# Patient Record
Sex: Female | Born: 1950 | Race: White | Hispanic: No | Marital: Married | Smoking: Former smoker
Health system: Southern US, Community
[De-identification: ages and names within clinical notes are randomized; demographics above are authoritative.]

## PROBLEM LIST (undated history)

## (undated) DIAGNOSIS — D860 Sarcoidosis of lung: Secondary | ICD-10-CM

## (undated) DIAGNOSIS — E669 Obesity, unspecified: Secondary | ICD-10-CM

## (undated) DIAGNOSIS — E039 Hypothyroidism, unspecified: Secondary | ICD-10-CM

## (undated) DIAGNOSIS — H3530 Unspecified macular degeneration: Secondary | ICD-10-CM

## (undated) DIAGNOSIS — E119 Type 2 diabetes mellitus without complications: Secondary | ICD-10-CM

## (undated) DIAGNOSIS — H8110 Benign paroxysmal vertigo, unspecified ear: Secondary | ICD-10-CM

## (undated) DIAGNOSIS — Z4542 Encounter for adjustment and management of neuropacemaker (brain) (peripheral nerve) (spinal cord): Secondary | ICD-10-CM

## (undated) DIAGNOSIS — J40 Bronchitis, not specified as acute or chronic: Secondary | ICD-10-CM

## (undated) DIAGNOSIS — S46819A Strain of other muscles, fascia and tendons at shoulder and upper arm level, unspecified arm, initial encounter: Secondary | ICD-10-CM

## (undated) DIAGNOSIS — M7580 Other shoulder lesions, unspecified shoulder: Secondary | ICD-10-CM

## (undated) DIAGNOSIS — K219 Gastro-esophageal reflux disease without esophagitis: Secondary | ICD-10-CM

## (undated) DIAGNOSIS — M7520 Bicipital tendinitis, unspecified shoulder: Secondary | ICD-10-CM

## (undated) DIAGNOSIS — N189 Chronic kidney disease, unspecified: Secondary | ICD-10-CM

## (undated) DIAGNOSIS — N184 Chronic kidney disease, stage 4 (severe): Secondary | ICD-10-CM

## (undated) DIAGNOSIS — D869 Sarcoidosis, unspecified: Secondary | ICD-10-CM

## (undated) DIAGNOSIS — M4800 Spinal stenosis, site unspecified: Secondary | ICD-10-CM

## (undated) DIAGNOSIS — N179 Acute kidney failure, unspecified: Secondary | ICD-10-CM

## (undated) DIAGNOSIS — H409 Unspecified glaucoma: Secondary | ICD-10-CM

## (undated) DIAGNOSIS — N10 Acute tubulo-interstitial nephritis: Secondary | ICD-10-CM

## (undated) DIAGNOSIS — G4733 Obstructive sleep apnea (adult) (pediatric): Secondary | ICD-10-CM

## (undated) DIAGNOSIS — R0600 Dyspnea, unspecified: Secondary | ICD-10-CM

## (undated) DIAGNOSIS — D649 Anemia, unspecified: Secondary | ICD-10-CM

## (undated) DIAGNOSIS — Z8669 Personal history of other diseases of the nervous system and sense organs: Secondary | ICD-10-CM

## (undated) DIAGNOSIS — I10 Essential (primary) hypertension: Secondary | ICD-10-CM

## (undated) HISTORY — PX: CARPAL TUNNEL RELEASE: SHX101

## (undated) HISTORY — PX: COLONOSCOPY: SHX174

## (undated) HISTORY — PX: NEUROMA SURGERY: SHX722

## (undated) HISTORY — PX: SHOULDER SURGERY: SHX246

## (undated) HISTORY — PX: GLAUCOMA VALVE INSERTION: SHX5297

## (undated) HISTORY — PX: EYE SURGERY: SHX253

## (undated) HISTORY — PX: APPENDECTOMY: SHX54

## (undated) HISTORY — PX: CHOLECYSTECTOMY: SHX55

## (undated) HISTORY — PX: SPINAL CORD STIMULATOR INSERTION: SHX5378

---

## 2019-02-20 DIAGNOSIS — D860 Sarcoidosis of lung: Secondary | ICD-10-CM | POA: Diagnosis present

## 2019-02-20 DIAGNOSIS — I10 Essential (primary) hypertension: Secondary | ICD-10-CM | POA: Diagnosis present

## 2019-02-20 DIAGNOSIS — E039 Hypothyroidism, unspecified: Secondary | ICD-10-CM | POA: Diagnosis present

## 2019-02-20 DIAGNOSIS — E119 Type 2 diabetes mellitus without complications: Secondary | ICD-10-CM

## 2020-08-23 ENCOUNTER — Encounter: Payer: Self-pay | Admitting: *Deleted

## 2020-08-24 ENCOUNTER — Ambulatory Visit: Payer: Medicare HMO | Admitting: Anesthesiology

## 2020-08-24 ENCOUNTER — Ambulatory Visit
Admission: RE | Admit: 2020-08-24 | Discharge: 2020-08-24 | Disposition: A | Discharge status: Home / Self Care | Payer: Medicare HMO | Attending: Gastroenterology | Admitting: Gastroenterology

## 2020-08-24 ENCOUNTER — Encounter: Payer: Self-pay | Admitting: Anesthesiology

## 2020-08-24 ENCOUNTER — Other Ambulatory Visit: Payer: Self-pay

## 2020-08-24 ENCOUNTER — Encounter
Admission: RE | Disposition: A | Discharge status: Home / Self Care | Payer: Self-pay | Source: Home / Self Care | Attending: Gastroenterology

## 2020-08-24 DIAGNOSIS — Z86010 Personal history of colonic polyps: Secondary | ICD-10-CM | POA: Insufficient documentation

## 2020-08-24 DIAGNOSIS — K6289 Other specified diseases of anus and rectum: Secondary | ICD-10-CM | POA: Diagnosis not present

## 2020-08-24 DIAGNOSIS — Z6838 Body mass index (BMI) 38.0-38.9, adult: Secondary | ICD-10-CM | POA: Insufficient documentation

## 2020-08-24 DIAGNOSIS — Z79899 Other long term (current) drug therapy: Secondary | ICD-10-CM | POA: Insufficient documentation

## 2020-08-24 DIAGNOSIS — K5730 Diverticulosis of large intestine without perforation or abscess without bleeding: Secondary | ICD-10-CM | POA: Diagnosis not present

## 2020-08-24 DIAGNOSIS — I10 Essential (primary) hypertension: Secondary | ICD-10-CM | POA: Insufficient documentation

## 2020-08-24 DIAGNOSIS — E119 Type 2 diabetes mellitus without complications: Secondary | ICD-10-CM | POA: Diagnosis not present

## 2020-08-24 DIAGNOSIS — Z87891 Personal history of nicotine dependence: Secondary | ICD-10-CM | POA: Insufficient documentation

## 2020-08-24 DIAGNOSIS — Z7984 Long term (current) use of oral hypoglycemic drugs: Secondary | ICD-10-CM | POA: Diagnosis not present

## 2020-08-24 DIAGNOSIS — Z880 Allergy status to penicillin: Secondary | ICD-10-CM | POA: Insufficient documentation

## 2020-08-24 DIAGNOSIS — Z7982 Long term (current) use of aspirin: Secondary | ICD-10-CM | POA: Diagnosis not present

## 2020-08-24 DIAGNOSIS — Z1211 Encounter for screening for malignant neoplasm of colon: Secondary | ICD-10-CM | POA: Diagnosis not present

## 2020-08-24 DIAGNOSIS — E669 Obesity, unspecified: Secondary | ICD-10-CM | POA: Insufficient documentation

## 2020-08-24 DIAGNOSIS — D1779 Benign lipomatous neoplasm of other sites: Secondary | ICD-10-CM | POA: Insufficient documentation

## 2020-08-24 HISTORY — DX: Type 2 diabetes mellitus without complications: E11.9

## 2020-08-24 HISTORY — DX: Sarcoidosis, unspecified: D86.9

## 2020-08-24 HISTORY — DX: Spinal stenosis, site unspecified: M48.00

## 2020-08-24 HISTORY — DX: Essential (primary) hypertension: I10

## 2020-08-24 HISTORY — PX: COLONOSCOPY WITH PROPOFOL: SHX5780

## 2020-08-24 HISTORY — DX: Gastro-esophageal reflux disease without esophagitis: K21.9

## 2020-08-24 HISTORY — DX: Unspecified macular degeneration: H35.30

## 2020-08-24 HISTORY — DX: Chronic kidney disease, unspecified: N18.9

## 2020-08-24 LAB — GLUCOSE, CAPILLARY: Glucose-Capillary: 142 mg/dL — ABNORMAL HIGH (ref 70–99)

## 2020-08-24 SURGERY — COLONOSCOPY WITH PROPOFOL
Anesthesia: General

## 2020-08-24 MED ORDER — SODIUM CHLORIDE 0.9 % IV SOLN: INTRAVENOUS | Status: DC

## 2020-08-24 MED ORDER — PROPOFOL 500 MG/50ML IV EMUL
INTRAVENOUS | Status: DC | PRN
  Administered 2020-08-24: 120 ug/kg/min via INTRAVENOUS

## 2020-08-24 MED ORDER — EPHEDRINE SULFATE 50 MG/ML IJ SOLN
INTRAMUSCULAR | Status: DC | PRN
  Administered 2020-08-24: 10 mg via INTRAVENOUS

## 2020-08-24 MED ORDER — PROPOFOL 500 MG/50ML IV EMUL
INTRAVENOUS | Status: AC
  Filled 2020-08-24: qty 50

## 2020-08-24 NOTE — Transfer of Care (Signed)
Immediate Anesthesia Transfer of Care Note  Patient: Lanea Vankirk  Procedure(s) Performed: COLONOSCOPY WITH PROPOFOL (N/A )  Patient Location: PACU  Anesthesia Type:General  Level of Consciousness: awake and sedated  Airway & Oxygen Therapy: Patient Spontanous Breathing and Patient connected to nasal cannula oxygen  Post-op Assessment: Report given to RN and Post -op Vital signs reviewed and stable  Post vital signs: Reviewed and stable  Last Vitals:  Vitals Value Taken Time  BP    Temp    Pulse    Resp    SpO2      Last Pain:  Vitals:   08/24/20 0846  TempSrc: Temporal  PainSc: 0-No pain         Complications: No complications documented.

## 2020-08-24 NOTE — Anesthesia Procedure Notes (Signed)
Performed by: Cook-Martin, Jettie Mannor °Pre-anesthesia Checklist: Patient identified, Emergency Drugs available, Suction available, Patient being monitored and Timeout performed °Patient Re-evaluated:Patient Re-evaluated prior to induction °Oxygen Delivery Method: Simple face mask °Preoxygenation: Pre-oxygenation with 100% oxygen °Induction Type: IV induction °Placement Confirmation: positive ETCO2 and CO2 detector ° ° ° ° ° ° °

## 2020-08-24 NOTE — H&P (Signed)
Outpatient short stay form Pre-procedure 08/24/2020 9:31 AM Rachael Miyamoto MD, MPH  Primary Physician: Dr. Edwina Barth  Reason for visit:  Surveillance colonoscopy  History of present illness:   70 y/o lady with obesity, hypertension, and DM II here for surveillance colonoscopy. Last colonoscopy was 4-5 years and was normal but had polyps previously. No family history of colon cancer. No blood thinners besides aspirin. History of appendectomy and cholecystectomy.    Current Facility-Administered Medications:  .  0.9 %  sodium chloride infusion, , Intravenous, Continuous, Tymika Grilli, Hilton Cork, MD, Last Rate: 20 mL/hr at 08/24/20 0931, Continued from Pre-op at 08/24/20 0931  Medications Prior to Admission  Medication Sig Dispense Refill Last Dose  . aspirin 81 MG chewable tablet Chew 81 mg by mouth daily.   Past Week at Unknown time  . atorvastatin (LIPITOR) 40 MG tablet Take 40 mg by mouth daily.   08/22/2020  . dorzolamide-timolol (COSOPT) 22.3-6.8 MG/ML ophthalmic solution 1 drop 2 (two) times daily.   08/22/2020  . enalapril (VASOTEC) 10 MG tablet Take 10 mg by mouth daily.   08/22/2020  . fluticasone (FLONASE) 50 MCG/ACT nasal spray Place into both nostrils daily.   08/22/2020  . Latanoprostene Bunod (VYZULTA) 0.024 % SOLN Apply to eye.   08/22/2020  . levothyroxine (SYNTHROID) 125 MCG tablet Take 125 mcg by mouth daily before breakfast.   08/22/2020  . metFORMIN (GLUMETZA) 1000 MG (MOD) 24 hr tablet Take 1,000 mg by mouth daily with breakfast.   08/22/2020  . montelukast (SINGULAIR) 10 MG tablet Take 10 mg by mouth at bedtime.   08/22/2020  . Netarsudil-Latanoprost 0.02-0.005 % SOLN Apply to eye.     Marland Kitchen omeprazole (PRILOSEC) 40 MG capsule Take 40 mg by mouth daily.   08/22/2020  . potassium chloride (KLOR-CON) 10 MEQ tablet Take 10 mEq by mouth daily.   08/22/2020     Allergies  Allergen Reactions  . Penicillins      Past Medical History:  Diagnosis Date  . CKD (chronic kidney disease)   .  Diabetes mellitus ([place])   . GERD (gastroesophageal reflux disease)   . HTN (hypertension)   . Macular degeneration   . Sarcoidosis   . Spinal stenosis     Review of systems:  Otherwise negative.    Physical Exam  Gen: Alert, oriented. Appears stated age.  HEENT: PERRLA. Lungs: No respiratory distress CV: RRR Abd: soft, benign, no masses Ext: No edema    Planned procedures: Proceed with colonoscopy. The patient understands the nature of the planned procedure, indications, risks, alternatives and potential complications including but not limited to bleeding, infection, perforation, damage to internal organs and possible oversedation/side effects from anesthesia. The patient agrees and gives consent to proceed.  Please refer to procedure notes for findings, recommendations and patient disposition/instructions.     Rachael Miyamoto MD, MPH Gastroenterology 08/24/2020  9:31 AM

## 2020-08-24 NOTE — Anesthesia Preprocedure Evaluation (Addendum)
Anesthesia Evaluation  Patient identified by MRN, date of birth, ID band Patient awake    Reviewed: Allergy & Precautions, NPO status , Patient's Chart, lab work & pertinent test results  Airway Mallampati: II  TM Distance: >3 FB     Dental   Pulmonary former smoker,    Pulmonary exam normal        Cardiovascular hypertension, Normal cardiovascular exam     Neuro/Psych negative neurological ROS  negative psych ROS   GI/Hepatic Neg liver ROS, GERD  ,  Endo/Other  diabetesHypothyroidism   Renal/GU Renal InsufficiencyRenal disease  negative genitourinary   Musculoskeletal negative musculoskeletal ROS (+)   Abdominal Normal abdominal exam  (+)   Peds negative pediatric ROS (+)  Hematology negative hematology ROS (+)   Anesthesia Other Findings Past Medical History: No date: CKD (chronic kidney disease) No date: Diabetes mellitus ([facility]) No date: GERD (gastroesophageal reflux disease) No date: HTN (hypertension) No date: Macular degeneration No date: Sarcoidosis No date: Spinal stenosis  Reproductive/Obstetrics                             Anesthesia Physical Anesthesia Plan  ASA: III  Anesthesia Plan: General   Post-op Pain Management:    Induction: Intravenous  PONV Risk Score and Plan: Propofol infusion and TIVA  Airway Management Planned: Nasal Cannula  Additional Equipment:   Intra-op Plan:   Post-operative Plan:   Informed Consent: I have reviewed the patients History and Physical, chart, labs and discussed the procedure including the risks, benefits and alternatives for the proposed anesthesia with the patient or authorized representative who has indicated his/her understanding and acceptance.     Dental advisory given  Plan Discussed with: Surgeon and CRNA  Anesthesia Plan Comments:         Anesthesia Quick Evaluation

## 2020-08-24 NOTE — Op Note (Signed)
[facility] Gastroenterology Patient Name: Rachael Barnes Procedure Date: 08/24/2020 9:22 AM MRN: [id number] Account #: [id number] Date of Birth: 16-Mar-1951 Admit Type: Outpatient Age: 70 Room: [facility] ENDO ROOM 1 Gender: Female Note Status: Finalized Procedure:             Colonoscopy Indications:           High risk colon cancer surveillance: Personal history                         of colonic polyps Providers:             Andrey Farmer MD, MD Referring MD:          Baxter Hire, MD (Referring MD) Medicines:             Monitored Anesthesia Care Complications:         No immediate complications. Procedure:             Pre-Anesthesia Assessment:                        - Prior to the procedure, a History and Physical was                         performed, and patient medications and allergies were                         reviewed. The patient is competent. The risks and                         benefits of the procedure and the sedation options and                         risks were discussed with the patient. All questions                         were answered and informed consent was obtained.                         Patient identification and proposed procedure were                         verified by the physician, the nurse, the anesthetist                         and the technician in the endoscopy suite. Mental                         Status Examination: alert and oriented. Airway                         Examination: normal oropharyngeal airway and neck                         mobility. Respiratory Examination: clear to                         auscultation. CV Examination: normal. Prophylactic  Antibiotics: The patient does not require prophylactic                         antibiotics. Prior Anticoagulants: The patient has                         taken no previous anticoagulant or antiplatelet                         agents. ASA Grade  Assessment: II - A patient with mild                         systemic disease. After reviewing the risks and                         benefits, the patient was deemed in satisfactory                         condition to undergo the procedure. The anesthesia                         plan was to use monitored anesthesia care (MAC).                         Immediately prior to administration of medications,                         the patient was re-assessed for adequacy to receive                         sedatives. The heart rate, respiratory rate, oxygen                         saturations, blood pressure, adequacy of pulmonary                         ventilation, and response to care were monitored                         throughout the procedure. The physical status of the                         patient was re-assessed after the procedure.                        After obtaining informed consent, the colonoscope was                         passed under direct vision. Throughout the procedure,                         the patient's blood pressure, pulse, and oxygen                         saturations were monitored continuously. The                         Colonoscope was introduced through the anus and  advanced to the the cecum, identified by appendiceal                         orifice and ileocecal valve. The colonoscopy was                         performed without difficulty. The patient tolerated                         the procedure well. The quality of the bowel                         preparation was excellent. Findings:      The perianal and digital rectal examinations were normal.      There was a small lipoma, in the ascending colon.      Scattered small-mouthed diverticula were found in the sigmoid colon.      Anal papilla(e) were hypertrophied. Impression:            - Small lipoma in the ascending colon.                        - Diverticulosis in the  sigmoid colon.                        - Anal papilla(e) were hypertrophied.                        - No specimens collected. Recommendation:        - Discharge patient to home.                        - Resume previous diet.                        - Continue present medications.                        - Repeat colonoscopy in 10 years for surveillance.                        - Return to referring physician as previously                         scheduled. Procedure Code(s):     --- Professional ---                        X6553, Colorectal cancer screening; colonoscopy on                         individual at high risk Diagnosis Code(s):     --- Professional ---                        Z86.010, Personal history of colonic polyps                        D17.5, Benign lipomatous neoplasm of intra-abdominal                         organs  K62.89, Other specified diseases of anus and rectum                        K57.30, Diverticulosis of large intestine without                         perforation or abscess without bleeding CPT copyright 2019 [employer]. All rights reserved. The codes documented in this report are preliminary and upon coder review may  be revised to meet current compliance requirements. Andrey Farmer MD, MD 08/24/2020 9:57:32 AM Number of Addenda: 0 Note Initiated On: 08/24/2020 9:22 AM Scope Withdrawal Time: 0 hours 9 minutes 5 seconds  Total Procedure Duration: 0 hours 13 minutes 55 seconds  Estimated Blood Loss:  Estimated blood loss: none.      [facility]

## 2020-08-24 NOTE — Anesthesia Postprocedure Evaluation (Signed)
Anesthesia Post Note  Patient: Rachael Barnes  Procedure(s) Performed: COLONOSCOPY WITH PROPOFOL (N/A )  Patient location during evaluation: Endoscopy Anesthesia Type: General Level of consciousness: awake and alert and oriented Pain management: pain level controlled Vital Signs Assessment: post-procedure vital signs reviewed and stable Respiratory status: spontaneous breathing Cardiovascular status: blood pressure returned to baseline Anesthetic complications: no   No complications documented.   Last Vitals:  Vitals:   08/24/20 1017 08/24/20 1027  BP: 91/67 124/75  Pulse:    Resp:    Temp:    SpO2: 97%     Last Pain:  Vitals:   08/24/20 1027  TempSrc:   PainSc: 0-No pain                 Alyssah Algeo

## 2020-08-25 ENCOUNTER — Encounter: Payer: Self-pay | Admitting: Gastroenterology

## 2021-04-14 ENCOUNTER — Other Ambulatory Visit: Payer: Self-pay

## 2021-04-14 ENCOUNTER — Emergency Department: Payer: Medicare HMO

## 2021-04-14 DIAGNOSIS — R42 Dizziness and giddiness: Secondary | ICD-10-CM | POA: Insufficient documentation

## 2021-04-14 DIAGNOSIS — Z79899 Other long term (current) drug therapy: Secondary | ICD-10-CM | POA: Diagnosis not present

## 2021-04-14 DIAGNOSIS — S82831A Other fracture of upper and lower end of right fibula, initial encounter for closed fracture: Secondary | ICD-10-CM | POA: Diagnosis not present

## 2021-04-14 DIAGNOSIS — Z87891 Personal history of nicotine dependence: Secondary | ICD-10-CM | POA: Insufficient documentation

## 2021-04-14 DIAGNOSIS — I129 Hypertensive chronic kidney disease with stage 1 through stage 4 chronic kidney disease, or unspecified chronic kidney disease: Secondary | ICD-10-CM | POA: Insufficient documentation

## 2021-04-14 DIAGNOSIS — Y900 Blood alcohol level of less than 20 mg/100 ml: Secondary | ICD-10-CM | POA: Insufficient documentation

## 2021-04-14 DIAGNOSIS — Z20822 Contact with and (suspected) exposure to covid-19: Secondary | ICD-10-CM | POA: Diagnosis not present

## 2021-04-14 DIAGNOSIS — E860 Dehydration: Secondary | ICD-10-CM | POA: Diagnosis not present

## 2021-04-14 DIAGNOSIS — Z7982 Long term (current) use of aspirin: Secondary | ICD-10-CM | POA: Insufficient documentation

## 2021-04-14 DIAGNOSIS — Z7984 Long term (current) use of oral hypoglycemic drugs: Secondary | ICD-10-CM | POA: Insufficient documentation

## 2021-04-14 DIAGNOSIS — N189 Chronic kidney disease, unspecified: Secondary | ICD-10-CM | POA: Insufficient documentation

## 2021-04-14 DIAGNOSIS — M79604 Pain in right leg: Secondary | ICD-10-CM | POA: Diagnosis not present

## 2021-04-14 DIAGNOSIS — I951 Orthostatic hypotension: Secondary | ICD-10-CM | POA: Diagnosis not present

## 2021-04-14 DIAGNOSIS — E1122 Type 2 diabetes mellitus with diabetic chronic kidney disease: Secondary | ICD-10-CM | POA: Diagnosis not present

## 2021-04-14 DIAGNOSIS — W1839XA Other fall on same level, initial encounter: Secondary | ICD-10-CM | POA: Diagnosis not present

## 2021-04-14 DIAGNOSIS — S8991XA Unspecified injury of right lower leg, initial encounter: Secondary | ICD-10-CM | POA: Diagnosis present

## 2021-04-14 LAB — TROPONIN I (HIGH SENSITIVITY)
Troponin I (High Sensitivity): 4 ng/L (ref <18)
Troponin I (High Sensitivity): 5 ng/L (ref <18)

## 2021-04-14 LAB — BASIC METABOLIC PANEL
Anion gap: 7 (ref 5–15)
BUN: 39 mg/dL — ABNORMAL HIGH (ref 8–23)
CO2: 16 mmol/L — ABNORMAL LOW (ref 22–32)
Calcium: 8.4 mg/dL — ABNORMAL LOW (ref 8.9–10.3)
Chloride: 116 mmol/L — ABNORMAL HIGH (ref 98–111)
Creatinine, Ser: 1.56 mg/dL — ABNORMAL HIGH (ref 0.44–1.00)
GFR, Estimated: 36 mL/min — ABNORMAL LOW (ref >60)
Glucose, Bld: 156 mg/dL — ABNORMAL HIGH (ref 70–99)
Potassium: 4.2 mmol/L (ref 3.5–5.1)
Sodium: 139 mmol/L (ref 135–145)

## 2021-04-14 LAB — CBC
HCT: 33.1 % — ABNORMAL LOW (ref 36.0–46.0)
Hemoglobin: 10.4 g/dL — ABNORMAL LOW (ref 12.0–15.0)
MCH: 26.6 pg (ref 26.0–34.0)
MCHC: 31.4 g/dL (ref 30.0–36.0)
MCV: 84.7 fL (ref 80.0–100.0)
Platelets: 268 10*3/uL (ref 150–400)
RBC: 3.91 MIL/uL (ref 3.87–5.11)
RDW: 14.6 % (ref 11.5–15.5)
WBC: 7.2 10*3/uL (ref 4.0–10.5)
nRBC: 0 % (ref 0.0–0.2)

## 2021-04-14 NOTE — ED Triage Notes (Signed)
Pt comes via eMS with c/o syncopal episode while sitting down. Pt was drinking a glass of wine and then all of the sudden passed out. Pt became incontinent of stool.  BP-100/60 sitting 69 pal standing  20 g in left hand, 150 cc of fluids CBG-165 98% RA 66-HR  Pt states some weakness. EMs reports neg stroke screen.

## 2021-04-15 ENCOUNTER — Emergency Department
Admission: EM | Admit: 2021-04-15 | Discharge: 2021-04-15 | Disposition: A | Discharge status: Home / Self Care | Payer: Medicare HMO | Attending: Emergency Medicine | Admitting: Emergency Medicine

## 2021-04-15 DIAGNOSIS — R531 Weakness: Secondary | ICD-10-CM

## 2021-04-15 DIAGNOSIS — E860 Dehydration: Secondary | ICD-10-CM

## 2021-04-15 DIAGNOSIS — S82831A Other fracture of upper and lower end of right fibula, initial encounter for closed fracture: Secondary | ICD-10-CM

## 2021-04-15 DIAGNOSIS — R42 Dizziness and giddiness: Secondary | ICD-10-CM

## 2021-04-15 DIAGNOSIS — W19XXXA Unspecified fall, initial encounter: Secondary | ICD-10-CM

## 2021-04-15 DIAGNOSIS — R55 Syncope and collapse: Secondary | ICD-10-CM

## 2021-04-15 LAB — RESP PANEL BY RT-PCR (FLU A&B, COVID) ARPGX2
Influenza A by PCR: NEGATIVE
Influenza B by PCR: NEGATIVE
SARS Coronavirus 2 by RT PCR: NEGATIVE

## 2021-04-15 LAB — ETHANOL: Alcohol, Ethyl (B): <10 mg/dL (ref <10)

## 2021-04-15 MED ORDER — OXYCODONE-ACETAMINOPHEN 5-325 MG PO TABS
1.0000 | ORAL_TABLET | ORAL | 0 refills | Status: DC | PRN
Start: 2021-04-15 — End: 2022-08-30

## 2021-04-15 MED ORDER — OXYCODONE-ACETAMINOPHEN 5-325 MG PO TABS
1.0000 | ORAL_TABLET | Freq: Once | ORAL | Status: AC
  Administered 2021-04-15: 06:00:00 1 via ORAL
  Filled 2021-04-15: qty 1

## 2021-04-15 MED ORDER — HYDROCODONE-ACETAMINOPHEN 5-325 MG PO TABS
1.0000 | ORAL_TABLET | Freq: Once | ORAL | Status: AC
  Administered 2021-04-15: 02:00:00 1 via ORAL
  Filled 2021-04-15: qty 1

## 2021-04-15 MED ORDER — SODIUM CHLORIDE 0.9 % IV BOLUS
1000.0000 mL | Freq: Once | INTRAVENOUS | Status: AC
  Administered 2021-04-15: 02:00:00 1000 mL via INTRAVENOUS

## 2021-04-15 MED ORDER — HYDROCODONE-ACETAMINOPHEN 5-325 MG PO TABS: 1.0000 | ORAL_TABLET | Freq: Four times a day (QID) | ORAL | 0 refills | Status: DC | PRN

## 2021-04-15 NOTE — ED Provider Notes (Signed)
[facility] Emergency Department Provider Note   ____________________________________________   Event Date/Time   First MD Initiated Contact with Patient 04/15/21 [phone]     (approximate)  I have reviewed the triage vital signs and the nursing notes.   HISTORY  Chief Complaint Near Syncope    HPI Greenley Martone is a 70 y.o. female who presents to the ED with EMS with a chief complaint of syncope.  Patient was finishing a glass of wine at a friend's house when she told her husband she did not feel well.  She put her head down on the table.  He pulled the car up to the door, she stood up, became lightheaded and had a syncopal episode.  Fell onto her right lower leg.  States she did not strike her head.  Woke up within 1 minutes and was incontinent of stool.  Denies vomiting.  Endorses generalized weakness and feeling dehydrated.  Denies pre-existing symptoms of headache, vision changes, fever, cough, chest pain, shortness of breath, abdominal pain, nausea, vomiting or diarrhea.  Currently complains of right lower leg pain.  EMS reports patient orthostatic on scene.  Denies recent travel or hormone use     Past Medical History:  Diagnosis Date   CKD (chronic kidney disease)    Diabetes mellitus (HCC)    GERD (gastroesophageal reflux disease)    HTN (hypertension)    Macular degeneration    Sarcoidosis    Spinal stenosis     There are no problems to display for this patient.   Past Surgical History:  Procedure Laterality Date   APPENDECTOMY     CHOLECYSTECTOMY     COLONOSCOPY     COLONOSCOPY WITH PROPOFOL N/A 08/24/2020   Procedure: COLONOSCOPY WITH PROPOFOL;  Surgeon: Lesly Rubenstein, MD;  Location: [facility] ENDOSCOPY;  Service: Endoscopy;  Laterality: N/A;  DM   EYE SURGERY     SPINAL CORD STIMULATOR INSERTION      Prior to Admission medications   Medication Sig Start Date End Date Taking? Authorizing Provider  aspirin 81 MG chewable tablet Chew 81  mg by mouth daily.    [provider]  atorvastatin (LIPITOR) 40 MG tablet Take 40 mg by mouth daily.    [provider]  dorzolamide-timolol (COSOPT) 22.3-6.8 MG/ML ophthalmic solution 1 drop 2 (two) times daily.    [provider]  enalapril (VASOTEC) 10 MG tablet Take 10 mg by mouth daily.    [provider]  fluticasone (FLONASE) 50 MCG/ACT nasal spray Place into both nostrils daily.    [provider]  Latanoprostene Bunod (VYZULTA) 0.024 % SOLN Apply to eye.    [provider]  levothyroxine (SYNTHROID) 125 MCG tablet Take 125 mcg by mouth daily before breakfast.    [provider]  metFORMIN (GLUMETZA) 1000 MG (MOD) 24 hr tablet Take 1,000 mg by mouth daily with breakfast.    [provider]  montelukast (SINGULAIR) 10 MG tablet Take 10 mg by mouth at bedtime.    [provider]  Netarsudil-Latanoprost 0.02-0.005 % SOLN Apply to eye.    [provider]  omeprazole (PRILOSEC) 40 MG capsule Take 40 mg by mouth daily.    [provider]  potassium chloride (KLOR-CON) 10 MEQ tablet Take 10 mEq by mouth daily.    [provider]    Allergies Penicillins  No family history on file.  Social History Social History   Tobacco Use   Smoking status: Former  Smokeless tobacco: Never  Substance Use Topics   Alcohol use: Never   Drug use: Never    Review of Systems  Constitutional: Positive for generalized weakness no fever/chills Eyes: No visual changes. ENT: No sore throat. Cardiovascular: Denies chest pain. Respiratory: Denies shortness of breath. Gastrointestinal: No abdominal pain.  No nausea, no vomiting.  No diarrhea.  No constipation. Genitourinary: Negative for dysuria. Musculoskeletal: Negative for back pain. Skin: Negative for rash. Neurological: Positive for syncope.  Negative for headaches, focal weakness or  numbness.   ____________________________________________   PHYSICAL EXAM:  VITAL SIGNS: ED Triage Vitals  Enc Vitals Group     BP 04/14/21 1515 (!) 104/59     Pulse Rate 04/14/21 1515 67     Resp 04/14/21 1515 20     Temp 04/14/21 1515 97.8 F (36.6 C)     Temp Source 04/14/21 1515 Oral     SpO2 04/14/21 1515 100 %     Weight --      Height --      Head Circumference --      Peak Flow --      Pain Score 04/14/21 1451 3     Pain Loc --      Pain Edu? --      Excl. in [place]? --     Constitutional: Alert and oriented.  Tired appearing and in no acute distress. Eyes: Conjunctivae are normal. PERRL. EOMI. Head: Atraumatic. Nose: Atraumatic. Mouth/Throat: Mucous membranes are mildly dry.  No dental malocclusion.   Neck: No stridor.  No cervical spine tenderness to palpation. Cardiovascular: Normal rate, regular rhythm. Grossly normal heart sounds.  Good peripheral circulation. Respiratory: Normal respiratory effort.  No retractions. Lungs CTAB. Gastrointestinal: Soft and nontender to light or deep palpation. No distention. No abdominal bruits. No CVA tenderness. Musculoskeletal:  RLE: Moderate swelling to lateral malleolus which is also tender to palpation with limited range of motion.  Able to range knee without difficulty or significant pain.  2+ distal pulses.  Brisk, less than 5-second cap refill. Neurologic: Alert and oriented x3.  CN II to XII grossly intact.  Normal speech and language. No gross focal neurologic deficits are appreciated.  Skin:  Skin is warm, dry and intact. No rash noted. Psychiatric: Mood and affect are normal. Speech and behavior are normal.  ____________________________________________   LABS (all labs ordered are listed, but only abnormal results are displayed)  Labs Reviewed  BASIC METABOLIC PANEL - Abnormal; Notable for the following components:      Result Value   Chloride 116 (*)    CO2 16 (*)    Glucose, Bld 156 (*)    BUN 39 (*)     Creatinine, Ser 1.56 (*)    Calcium 8.4 (*)    GFR, Estimated 36 (*)    All other components within normal limits  CBC - Abnormal; Notable for the following components:   Hemoglobin 10.4 (*)    HCT 33.1 (*)    All other components within normal limits  RESP PANEL BY RT-PCR (FLU A&B, COVID) ARPGX2  ETHANOL  URINALYSIS, ROUTINE W REFLEX MICROSCOPIC  CBG MONITORING, ED  TROPONIN I (HIGH SENSITIVITY)  TROPONIN I (HIGH SENSITIVITY)   ____________________________________________  EKG  ED ECG REPORT I, Cully Luckow J, the attending physician, personally viewed and interpreted this ECG.   Date: 04/15/2021  EKG Time: 1524  Rate: 64  Rhythm: normal sinus rhythm  Axis: Normal  Intervals:none  ST&T Change: Nonspecific  ____________________________________________  RADIOLOGY I,  Nichlos Kunzler J, personally viewed and evaluated these images (plain radiographs) as part of my medical decision making, as well as reviewing the written report by the radiologist.  ED MD interpretation: No ICH, no acute cardiopulmonary process, nondisplaced proximal fibular head fracture, oblique distal fibular diaphyseal fracture with small medial talar avulsion fracture  Official radiology report(s): CT Head Wo Contrast  Result Date: 04/14/2021 CLINICAL DATA:  Mental status changes of unknown etiology. EXAM: CT HEAD WITHOUT CONTRAST TECHNIQUE: Contiguous axial images were obtained from the base of the skull through the vertex without intravenous contrast. COMPARISON:  None. FINDINGS: Brain: No evidence of acute infarction, hemorrhage, hydrocephalus, extra-axial collection or mass lesion/mass effect. Mild cerebral and cerebellar cortical atrophy. Vascular: No hyperdense vessel or unexpected calcification. Skull: Normal. Negative for fracture or focal lesion. Sinuses/Orbits: Evidence of prior lens replacements. There is mild membrane thickening in the ethmoid air cells, 1 cm retention cyst or polyp posteriorly in the  left maxillary sinus. Other sinuses and bilateral mastoid air cells are clear. Nasal septum deviates to the right. Other: None. IMPRESSION: No acute intracranial CT findings. Electronically Signed   By: Telford Nab M.D.   On: 04/14/2021 23:45   DG Chest Port 1 View  Result Date: 04/15/2021 CLINICAL DATA:  [place], syncope EXAM: PORTABLE CHEST 1 VIEW COMPARISON:  None. FINDINGS: The heart size and mediastinal contours are within normal limits. Both lungs are clear. The visualized skeletal structures are unremarkable. IMPRESSION: No active disease. Electronically Signed   By: Randa Ngo M.D.   On: 04/15/2021 00:06   DG Knee Complete 4 Views Right  Result Date: 04/15/2021 CLINICAL DATA:  Right knee pain, fell EXAM: RIGHT KNEE - COMPLETE 4+ VIEW COMPARISON:  None. FINDINGS: Frontal, bilateral oblique, lateral views of the right knee are obtained. There is a nondisplaced fracture of the proximal fibula, best seen on the internal oblique projection. No other acute bony abnormalities. 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 No significant joint effusion. Soft tissues are unremarkable. IMPRESSION: 1. Nondisplaced proximal fibular head fracture. 2. 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 Electronically Signed   By: Randa Ngo M.D.   On: 04/15/2021 00:04   DG Ankle Right Port  Result Date: 04/15/2021 CLINICAL DATA:  Right ankle pain, fell EXAM: PORTABLE RIGHT ANKLE - 2 VIEW COMPARISON:  None. FINDINGS: Frontal, oblique, lateral views of the right ankle are obtained. There is an oblique distal fibular diaphyseal fracture which is minimally displaced. Small avulsion fracture off the medial malleolus. Ankle mortise is intact. Mild midfoot and hindfoot osteoarthritis. Small inferior calcaneal spur. Diffuse soft tissue swelling greatest laterally. IMPRESSION: 1. Oblique  distal fibular diaphyseal fracture. 2. Small medial malleolar avulsion fracture. 3. Diffuse soft tissue swelling. Electronically Signed   By: Randa Ngo M.D.   On: 04/15/2021 00:05    ____________________________________________   PROCEDURES  Procedure(s) performed (including Critical Care):  .Ortho Injury Treatment  Date/Time: 04/15/2021 7:04 AM Performed by: Paulette Blanch, MD Authorized by: Paulette Blanch, MD   Consent:    Consent obtained:  Verbal   Consent given by:  Patient   Risks discussed:  FractureInjury location: lower leg Location details: right lower leg Injury type: fracture Fracture type: fibular shaft Pre-procedure neurovascular assessment: neurovascularly intact Pre-procedure distal perfusion: normal Pre-procedure neurological function: normal Pre-procedure range of motion: reduced  Anesthesia: Local anesthesia used: no  Patient sedated: NoManipulation performed: no Immobilization: splint Splint type: long leg and  ankle stirrup Splint Applied by: ED Tech Supplies used: Ortho-Glass, elastic bandage and cotton padding Post-procedure neurovascular assessment: post-procedure neurovascularly intact Post-procedure distal perfusion: normal Post-procedure neurological function: normal Post-procedure range of motion: unchanged     ____________________________________________   INITIAL IMPRESSION / ASSESSMENT AND PLAN / ED COURSE  As part of my medical decision making, I reviewed the following data within the [place] notes reviewed and incorporated, Labs reviewed, EKG interpreted, Old chart reviewed, Radiograph reviewed, Notes from prior ED visits, and [place] Controlled Substance Database     70 year old female who presents with syncopal episode.  Differential diagnosis includes but is not limited to [place], CVA, ACS, orthostatic, infectious, metabolic etiologies, etc.  Laboratory results unremarkable including 2 sets of negative  troponin.  Baseline CKD compared to 11/2020.  Will initiate IV fluid resuscitation, respiratory swab, awaiting UA.  Administer Norco for pain, place OCL splint to right lower leg.  Will reassess.  Clinical Course as of 04/15/21 0704  Fri Apr 15, 2021  0413 Patient resting in no acute distress.  Updated her on negative respiratory panel.  Will place an OCL splint, provide walker and discharged home on analgesics.  Patient will follow up with orthopedics next week.  Return precautions given.  Patient verbalizes understanding agrees with plan of care. [JS]    Clinical Course User Index [JS] Paulette Blanch, MD     ____________________________________________   FINAL CLINICAL IMPRESSION(S) / ED DIAGNOSES  Final diagnoses:  Fall  Dehydration  Generalized weakness  Other fracture of upper and lower end of right fibula, initial encounter for closed fracture  Orthostatic dizziness     ED Discharge Orders     None        Note:  This document was prepared using Dragon voice recognition software and may include unintentional dictation errors.    Paulette Blanch, MD 04/15/21 [phone]

## 2021-04-15 NOTE — ED Notes (Signed)
Patient Alert and oriented to baseline. Stable and ambulatory to baseline. Patient verbalized understanding of the discharge instructions.  Patient belongings were taken by the patient.   

## 2021-04-15 NOTE — Discharge Instructions (Addendum)
1.  You may take Ibuprofen as needed for pain; Percocet as needed for more severe pain. 2.  Do not bear weight on right leg until seen by the orthopedic specialist.  Keep splint clean and dry.  Elevate leg and apply ice over splint several times daily to reduce swelling.  Use walker to help you balance as you walk. 3.  Drink plenty of fluids daily. 4.  Return to the ER for worsening symptoms, persistent vomiting, difficulty breathing or other concerns.

## 2021-11-06 ENCOUNTER — Emergency Department: Payer: Medicare HMO

## 2021-11-06 ENCOUNTER — Encounter: Payer: Self-pay | Admitting: Emergency Medicine

## 2021-11-06 ENCOUNTER — Inpatient Hospital Stay
Admission: EM | Admit: 2021-11-06 | Discharge: 2021-11-09 | DRG: 690 | Disposition: A | Discharge status: Home / Self Care | Payer: Medicare HMO | Attending: Internal Medicine | Admitting: Internal Medicine

## 2021-11-06 ENCOUNTER — Other Ambulatory Visit: Payer: Self-pay

## 2021-11-06 DIAGNOSIS — Z79890 Hormone replacement therapy (postmenopausal): Secondary | ICD-10-CM

## 2021-11-06 DIAGNOSIS — N10 Acute tubulo-interstitial nephritis: Secondary | ICD-10-CM | POA: Diagnosis not present

## 2021-11-06 DIAGNOSIS — Z880 Allergy status to penicillin: Secondary | ICD-10-CM

## 2021-11-06 DIAGNOSIS — T465X5A Adverse effect of other antihypertensive drugs, initial encounter: Secondary | ICD-10-CM | POA: Diagnosis present

## 2021-11-06 DIAGNOSIS — I10 Essential (primary) hypertension: Secondary | ICD-10-CM | POA: Diagnosis present

## 2021-11-06 DIAGNOSIS — K219 Gastro-esophageal reflux disease without esophagitis: Secondary | ICD-10-CM | POA: Diagnosis present

## 2021-11-06 DIAGNOSIS — E1122 Type 2 diabetes mellitus with diabetic chronic kidney disease: Secondary | ICD-10-CM | POA: Diagnosis present

## 2021-11-06 DIAGNOSIS — Z7984 Long term (current) use of oral hypoglycemic drugs: Secondary | ICD-10-CM

## 2021-11-06 DIAGNOSIS — N184 Chronic kidney disease, stage 4 (severe): Secondary | ICD-10-CM | POA: Diagnosis present

## 2021-11-06 DIAGNOSIS — I129 Hypertensive chronic kidney disease with stage 1 through stage 4 chronic kidney disease, or unspecified chronic kidney disease: Secondary | ICD-10-CM | POA: Diagnosis present

## 2021-11-06 DIAGNOSIS — E119 Type 2 diabetes mellitus without complications: Secondary | ICD-10-CM

## 2021-11-06 DIAGNOSIS — Z79899 Other long term (current) drug therapy: Secondary | ICD-10-CM

## 2021-11-06 DIAGNOSIS — N12 Tubulo-interstitial nephritis, not specified as acute or chronic: Secondary | ICD-10-CM | POA: Diagnosis not present

## 2021-11-06 DIAGNOSIS — R10A1 Flank pain, right side: Secondary | ICD-10-CM | POA: Diagnosis present

## 2021-11-06 DIAGNOSIS — K5730 Diverticulosis of large intestine without perforation or abscess without bleeding: Secondary | ICD-10-CM | POA: Diagnosis present

## 2021-11-06 DIAGNOSIS — N179 Acute kidney failure, unspecified: Secondary | ICD-10-CM | POA: Diagnosis present

## 2021-11-06 DIAGNOSIS — E039 Hypothyroidism, unspecified: Secondary | ICD-10-CM | POA: Diagnosis present

## 2021-11-06 DIAGNOSIS — H3530 Unspecified macular degeneration: Secondary | ICD-10-CM | POA: Diagnosis present

## 2021-11-06 DIAGNOSIS — I700 Atherosclerosis of aorta: Secondary | ICD-10-CM | POA: Diagnosis present

## 2021-11-06 DIAGNOSIS — D860 Sarcoidosis of lung: Secondary | ICD-10-CM | POA: Diagnosis present

## 2021-11-06 DIAGNOSIS — Z87442 Personal history of urinary calculi: Secondary | ICD-10-CM

## 2021-11-06 DIAGNOSIS — T502X5A Adverse effect of carbonic-anhydrase inhibitors, benzothiadiazides and other diuretics, initial encounter: Secondary | ICD-10-CM | POA: Diagnosis present

## 2021-11-06 DIAGNOSIS — R109 Unspecified abdominal pain: Secondary | ICD-10-CM | POA: Diagnosis present

## 2021-11-06 DIAGNOSIS — Z7982 Long term (current) use of aspirin: Secondary | ICD-10-CM

## 2021-11-06 LAB — URINALYSIS, ROUTINE W REFLEX MICROSCOPIC
Bilirubin Urine: NEGATIVE
Glucose, UA: NEGATIVE mg/dL
Ketones, ur: NEGATIVE mg/dL
Nitrite: NEGATIVE
Protein, ur: 30 mg/dL — AB
Specific Gravity, Urine: 1.012 (ref 1.005–1.030)
pH: 5 (ref 5.0–8.0)

## 2021-11-06 LAB — LIPASE, BLOOD: Lipase: 44 U/L (ref 11–51)

## 2021-11-06 LAB — CBC WITH DIFFERENTIAL/PLATELET
Abs Immature Granulocytes: 0.03 10*3/uL (ref 0.00–0.07)
Basophils Absolute: 0 10*3/uL (ref 0.0–0.1)
Basophils Relative: 0 %
Eosinophils Absolute: 0.1 10*3/uL (ref 0.0–0.5)
Eosinophils Relative: 2 %
HCT: 30.8 % — ABNORMAL LOW (ref 36.0–46.0)
Hemoglobin: 9.4 g/dL — ABNORMAL LOW (ref 12.0–15.0)
Immature Granulocytes: 0 %
Lymphocytes Relative: 10 %
Lymphs Abs: 0.7 10*3/uL (ref 0.7–4.0)
MCH: 26 pg (ref 26.0–34.0)
MCHC: 30.5 g/dL (ref 30.0–36.0)
MCV: 85.3 fL (ref 80.0–100.0)
Monocytes Absolute: 0.3 10*3/uL (ref 0.1–1.0)
Monocytes Relative: 4 %
Neutro Abs: 5.9 10*3/uL (ref 1.7–7.7)
Neutrophils Relative %: 84 %
Platelets: 234 10*3/uL (ref 150–400)
RBC: 3.61 MIL/uL — ABNORMAL LOW (ref 3.87–5.11)
RDW: 14.9 % (ref 11.5–15.5)
WBC: 7.1 10*3/uL (ref 4.0–10.5)
nRBC: 0 % (ref 0.0–0.2)

## 2021-11-06 LAB — LACTIC ACID, PLASMA: Lactic Acid, Venous: 1.6 mmol/L (ref 0.5–1.9)

## 2021-11-06 LAB — BASIC METABOLIC PANEL
Anion gap: 9 (ref 5–15)
BUN: 38 mg/dL — ABNORMAL HIGH (ref 8–23)
CO2: 21 mmol/L — ABNORMAL LOW (ref 22–32)
Calcium: 9.4 mg/dL (ref 8.9–10.3)
Chloride: 112 mmol/L — ABNORMAL HIGH (ref 98–111)
Creatinine, Ser: 2.33 mg/dL — ABNORMAL HIGH (ref 0.44–1.00)
GFR, Estimated: 22 mL/min — ABNORMAL LOW (ref >60)
Glucose, Bld: 160 mg/dL — ABNORMAL HIGH (ref 70–99)
Potassium: 5 mmol/L (ref 3.5–5.1)
Sodium: 142 mmol/L (ref 135–145)

## 2021-11-06 MED ORDER — HEPARIN SODIUM (PORCINE) 5000 UNIT/ML IJ SOLN
5000.0000 [IU] | Freq: Three times a day (TID) | INTRAMUSCULAR | Status: DC
Start: 2021-11-06 — End: 2021-11-09
  Administered 2021-11-06 – 2021-11-09 (×8): 5000 [IU] via SUBCUTANEOUS
  Filled 2021-11-06 (×8): qty 1

## 2021-11-06 MED ORDER — PANTOPRAZOLE SODIUM 40 MG PO TBEC
40.0000 mg | DELAYED_RELEASE_TABLET | Freq: Every day | ORAL | Status: DC
  Administered 2021-11-07 – 2021-11-09 (×3): 40 mg via ORAL
  Filled 2021-11-06 (×3): qty 1

## 2021-11-06 MED ORDER — SODIUM CHLORIDE 0.9 % IV BOLUS
1000.0000 mL | Freq: Once | INTRAVENOUS | Status: AC
  Administered 2021-11-06: 1000 mL via INTRAVENOUS

## 2021-11-06 MED ORDER — SODIUM CHLORIDE 0.9% FLUSH
3.0000 mL | Freq: Two times a day (BID) | INTRAVENOUS | Status: DC
  Administered 2021-11-06 – 2021-11-09 (×6): 3 mL via INTRAVENOUS

## 2021-11-06 MED ORDER — CEFTRIAXONE SODIUM 1 G IJ SOLR: 1.0000 g | Freq: Once | INTRAMUSCULAR | Status: DC

## 2021-11-06 MED ORDER — MORPHINE SULFATE (PF) 2 MG/ML IV SOLN: 2.0000 mg | INTRAVENOUS | Status: DC | PRN

## 2021-11-06 MED ORDER — INSULIN ASPART 100 UNIT/ML IJ SOLN
0.0000 [IU] | Freq: Three times a day (TID) | INTRAMUSCULAR | Status: DC
  Administered 2021-11-07 (×3): 1 [IU] via SUBCUTANEOUS
  Administered 2021-11-08: 2 [IU] via SUBCUTANEOUS
  Administered 2021-11-08: 1 [IU] via SUBCUTANEOUS
  Filled 2021-11-06 (×5): qty 1

## 2021-11-06 MED ORDER — MONTELUKAST SODIUM 10 MG PO TABS
10.0000 mg | ORAL_TABLET | Freq: Every day | ORAL | Status: DC
  Administered 2021-11-06 – 2021-11-08 (×3): 10 mg via ORAL
  Filled 2021-11-06 (×3): qty 1

## 2021-11-06 MED ORDER — OXYCODONE-ACETAMINOPHEN 5-325 MG PO TABS: 1.0000 | ORAL_TABLET | ORAL | Status: DC | PRN

## 2021-11-06 MED ORDER — HYDRALAZINE HCL 20 MG/ML IJ SOLN: 10.0000 mg | INTRAMUSCULAR | Status: DC | PRN

## 2021-11-06 MED ORDER — ACETAMINOPHEN 650 MG RE SUPP: 650.0000 mg | Freq: Four times a day (QID) | RECTAL | Status: DC | PRN

## 2021-11-06 MED ORDER — ACETAMINOPHEN 325 MG PO TABS: 650.0000 mg | ORAL_TABLET | Freq: Four times a day (QID) | ORAL | Status: DC | PRN

## 2021-11-06 MED ORDER — ATORVASTATIN CALCIUM 20 MG PO TABS
40.0000 mg | ORAL_TABLET | Freq: Every day | ORAL | Status: DC
  Administered 2021-11-07 – 2021-11-09 (×3): 40 mg via ORAL
  Filled 2021-11-06 (×3): qty 2

## 2021-11-06 MED ORDER — FLUTICASONE PROPIONATE 50 MCG/ACT NA SUSP
1.0000 | Freq: Every day | NASAL | Status: DC
  Administered 2021-11-07 – 2021-11-09 (×3): 1 via NASAL
  Filled 2021-11-06: qty 16

## 2021-11-06 MED ORDER — SODIUM CHLORIDE 0.9 % IV SOLN: INTRAVENOUS | Status: AC

## 2021-11-06 MED ORDER — POTASSIUM CHLORIDE CRYS ER 10 MEQ PO TBCR
10.0000 meq | EXTENDED_RELEASE_TABLET | Freq: Every day | ORAL | Status: DC
  Administered 2021-11-07 – 2021-11-09 (×3): 10 meq via ORAL
  Filled 2021-11-06 (×3): qty 1

## 2021-11-06 MED ORDER — SODIUM CHLORIDE 0.9 % IV SOLN
1.0000 g | INTRAVENOUS | Status: DC
Start: 2021-11-07 — End: 2021-11-07

## 2021-11-06 MED ORDER — SODIUM CHLORIDE 0.9 % IV SOLN
1.0000 g | Freq: Once | INTRAVENOUS | Status: AC
  Administered 2021-11-06: 1 g via INTRAVENOUS
  Filled 2021-11-06: qty 10

## 2021-11-06 MED ORDER — ASPIRIN 81 MG PO CHEW
81.0000 mg | CHEWABLE_TABLET | Freq: Every day | ORAL | Status: DC
  Administered 2021-11-07 – 2021-11-09 (×3): 81 mg via ORAL
  Filled 2021-11-06 (×3): qty 1

## 2021-11-06 MED ORDER — LEVOTHYROXINE SODIUM 125 MCG PO TABS
125.0000 ug | ORAL_TABLET | Freq: Every day | ORAL | Status: DC
  Administered 2021-11-07 – 2021-11-09 (×3): 125 ug via ORAL
  Filled 2021-11-06 (×3): qty 1

## 2021-11-06 MED ORDER — DORZOLAMIDE HCL-TIMOLOL MAL 2-0.5 % OP SOLN
1.0000 [drp] | Freq: Two times a day (BID) | OPHTHALMIC | Status: DC
Start: 2021-11-07 — End: 2021-11-09
  Administered 2021-11-07 – 2021-11-09 (×5): 1 [drp] via OPHTHALMIC
  Filled 2021-11-06: qty 10

## 2021-11-06 MED ORDER — HYDROCODONE-ACETAMINOPHEN 5-325 MG PO TABS: 1.0000 | ORAL_TABLET | ORAL | Status: DC | PRN

## 2021-11-06 NOTE — Assessment & Plan Note (Signed)
No complaints of shortness of breath. Supplemental oxygen as deemed appropriate as needed for goal O2 sats of 90% and above.

## 2021-11-06 NOTE — Assessment & Plan Note (Addendum)
-  See above.   -Urine cultures with insignificant growth.   -Patient however presentation concerning for acute pyelonephritis versus ureteral stone.   -Continue IV antibiotics, if remains febrile in the next 24 hours, with no significant leukocytosis could likely transition to oral antibiotics of cefadroxil tomorrow and treat empirically for 5 to 7 days.

## 2021-11-06 NOTE — ED Provider Triage Note (Signed)
Emergency Medicine Provider Triage Evaluation Note  Rachael Barnes , a 71 y.o. female  was evaluated in triage.  Pt complains of onset of right sided flank pain and vomiting x2 today.  Patient denies any previous history of kidney stones.  She has noticed that it is more difficult to urinate.  Review of Systems  Positive: Flank pain, nausea, vomiting. Negative: Fever, chills  Physical Exam  There were no vitals taken for this visit. Gen:   Awake, no distress   Resp:  Normal effort lungs are clear bilaterally. MSK:   Moves extremities without difficulty  Other:  Positive right flank tenderness to percussion.  Medical Decision Making  Medically screening exam initiated at 2:56 PM.  Appropriate orders placed.  Rachael Barnes was informed that the remainder of the evaluation will be completed by another provider, this initial triage assessment does not replace that evaluation, and the importance of remaining in the ED until their evaluation is complete.     Johnn Hai, PA-C 11/06/21 1459

## 2021-11-06 NOTE — ED Provider Notes (Signed)
[facility] Provider Note  Patient Contact: 9:33 PM (approximate)   History   Back Pain   HPI  Rachael Barnes is a 71 y.o. female with a history of chronic kidney disease, diabetes, GERD and hypertension, presents to the emergency department with right-sided flank pain that started today with decreased urinary output.  Patient stated that she had some vomiting this morning but none recently.  She has had some chills but no fever.  She has a history of nephrolithiasis in the past.  No chest pain, chest tightness or shortness of breath.      Physical Exam   Triage Vital Signs: ED Triage Vitals  Enc Vitals Group     BP 11/06/21 1456 (!) 97/56     Pulse Rate 11/06/21 1456 (!) 58     Resp 11/06/21 1456 16     Temp 11/06/21 1456 98 F (36.7 C)     Temp Source 11/06/21 2121 Oral     SpO2 11/06/21 1456 100 %     Weight --      Height --      Head Circumference --      Peak Flow --      Pain Score 11/06/21 1454 10     Pain Loc --      Pain Edu? --      Excl. in [place]? --     Most recent vital signs: Vitals:   11/06/21 1658 11/06/21 2121  BP: 138/66 130/67  Pulse: (!) 57 65  Resp: 18 16  Temp: 98.3 F (36.8 C) 98 F (36.7 C)  SpO2: 100% 98%     General: Alert and in no acute distress. Eyes:  PERRL. EOMI. Head: No acute traumatic findings ENT:      Nose: No congestion/rhinnorhea.      Mouth/Throat: Mucous membranes are moist. Neck: No stridor. No cervical spine tenderness to palpation.  Cardiovascular:  Good peripheral perfusion Respiratory: Normal respiratory effort without tachypnea or retractions. Lungs CTAB. Good air entry to the bases with no decreased or absent breath sounds. Gastrointestinal: Bowel sounds 4 quadrants. Soft and nontender to palpation. No guarding or rigidity. No palpable masses. No distention. No CVA tenderness. Musculoskeletal: Full range of motion to all extremities.  Neurologic:  No gross focal neurologic deficits are  appreciated.  Skin:   No rash noted Other:   ED Results / Procedures / Treatments   Labs (all labs ordered are listed, but only abnormal results are displayed) Labs Reviewed  CBC WITH DIFFERENTIAL/PLATELET - Abnormal; Notable for the following components:      Result Value   RBC 3.61 (*)    Hemoglobin 9.4 (*)    HCT 30.8 (*)    All other components within normal limits  BASIC METABOLIC PANEL - Abnormal; Notable for the following components:   Chloride 112 (*)    CO2 21 (*)    Glucose, Bld 160 (*)    BUN 38 (*)    Creatinine, Ser 2.33 (*)    GFR, Estimated 22 (*)    All other components within normal limits  URINALYSIS, ROUTINE W REFLEX MICROSCOPIC - Abnormal; Notable for the following components:   Color, Urine YELLOW (*)    APPearance CLOUDY (*)    Hgb urine dipstick MODERATE (*)    Protein, ur 30 (*)    Leukocytes,Ua SMALL (*)    Bacteria, UA MANY (*)    All other components within normal limits  URINE CULTURE  LIPASE, BLOOD  LACTIC ACID, PLASMA  LACTIC ACID, PLASMA  HEMOGLOBIN A1C  CBC  CREATININE, SERUM  HIV ANTIBODY (ROUTINE TESTING W REFLEX)  COMPREHENSIVE METABOLIC PANEL  CBC        RADIOLOGY  I personally viewed and evaluated these images as part of my medical decision making, as well as reviewing the written report by the radiologist.  ED Provider Interpretation: Mild prominence of the right renal pelvis visualized without renal stone visualized on CT renal stone study.   PROCEDURES:  Critical Care performed: No  Procedures   MEDICATIONS ORDERED IN ED: Medications  cefTRIAXone (ROCEPHIN) 1 g in sodium chloride 0.9 % 100 mL IVPB (has no administration in time range)  aspirin chewable tablet 81 mg (has no administration in time range)  atorvastatin (LIPITOR) tablet 40 mg (has no administration in time range)  dorzolamide-timolol (COSOPT) 22.3-6.8 MG/ML ophthalmic solution 1 drop (has no administration in time range)  fluticasone (FLONASE) 50  MCG/ACT nasal spray 1 spray (has no administration in time range)  potassium chloride (KLOR-CON M) CR tablet 10 mEq (has no administration in time range)  levothyroxine (SYNTHROID) tablet 125 mcg (has no administration in time range)  montelukast (SINGULAIR) tablet 10 mg (has no administration in time range)  oxyCODONE-acetaminophen (PERCOCET/ROXICET) 5-325 MG per tablet 1 tablet (has no administration in time range)  pantoprazole (PROTONIX) EC tablet 40 mg (has no administration in time range)  heparin injection 5,000 Units (has no administration in time range)  sodium chloride flush (NS) 0.9 % injection 3 mL (has no administration in time range)  0.9 %  sodium chloride infusion (has no administration in time range)  acetaminophen (TYLENOL) tablet 650 mg (has no administration in time range)    Or  acetaminophen (TYLENOL) suppository 650 mg (has no administration in time range)  HYDROcodone-acetaminophen (NORCO/VICODIN) 5-325 MG per tablet 1 tablet (has no administration in time range)  morphine (PF) 2 MG/ML injection 2 mg (has no administration in time range)  insulin aspart (novoLOG) injection 0-9 Units (has no administration in time range)  hydrALAZINE (APRESOLINE) injection 10 mg (has no administration in time range)  sodium chloride 0.9 % bolus 1,000 mL (1,000 mLs Intravenous Bolus 11/06/21 2138)  cefTRIAXone (ROCEPHIN) 1 g in sodium chloride 0.9 % 100 mL IVPB (0 g Intravenous Stopped 11/06/21 2251)     IMPRESSION / MDM / ASSESSMENT AND PLAN / ED COURSE  I reviewed the triage vital signs and the nursing notes.                              Assessment and plan Flank pain 71 year old female presents to the emergency department with right-sided flank pain that started this morning.  Vital signs are reassuring at triage.  On exam, patient was alert and nontoxic-appearing.  Her creatinine was elevated from prior labs at 2.33 with GFR less than 22.  CBC indicated normal white blood cell  count.  Lipase within range.  We will await urinalysis and will reassess.   Urinalysis concerning for UTI with many bacteria.  Given AKI and likely pyelonephritis, will admit for rehydration and IV antibiotics.  Patient was excepted for admission under the care of Dr. Posey Pronto.  FINAL CLINICAL IMPRESSION(S) / ED DIAGNOSES   Final diagnoses:  Pyelonephritis  AKI (acute kidney injury) ([place])     Rx / DC Orders   ED Discharge Orders     None        Note:  This document was prepared using Dragon voice recognition software and may include unintentional dictation errors.   Vallarie Mare Devens, PA-C 11/06/21 2253    Arta Silence, MD 11/06/21 [phone]

## 2021-11-06 NOTE — Assessment & Plan Note (Addendum)
Blood pressure 130/67, pulse 65, temperature 98 F (36.7 C), temperature source Oral, resp. rate 16, SpO2 98 %. Blood pressure on arrival was soft so antihypertensive medications on hold.   -Follow.

## 2021-11-06 NOTE — H&P (Signed)
History and Physical    Rachael Barnes BPJ:[id number] DOB: 04-17-1951 DOA: 11/06/2021  PCP: Baxter Hire, MD    Patient coming from:  Home    Chief Complaint:  Rt flank pain.    HPI:  Rachael Barnes is a 71 y.o. female seen in ed with complaints of right flank pain x1 day. Pain is constant, severe, associated with vomiting. Patient also reports dysuria and decreased urine output. Patient also reports chills no fever has a history of nephrolithiasis. Rt flank pain:  Duration: 1 PM today.   Frequency: constant .  Location: rt flank   Quality: sharp pain   Rate: >10/ 10   Radiation: NR  Aggravating: N/A.  Alleviating: N/A  Associated factors:  N/V.  t has past medical history of diabetes mellitus type 2, CKD, GERD, hypertension, sarcoidosis, spinal stenosis.  Patient does not report any headaches blurred vision speech or gait issues fevers, bowel movement changes, bleeding.  ED Course:   Vitals:   11/06/21 1456 11/06/21 1658 11/06/21 2121  BP: (!) 97/56 138/66 130/67  Pulse: (!) 58 (!) 57 65  Resp: '16 18 16  ' Temp: 98 F (36.7 C) 98.3 F (36.8 C) 98 F (36.7 C)  TempSrc:   Oral  SpO2: 100% 100% 98%  On initial presentation in the emergency room patient was hypotensive and bradycardic afebrile O2 sats of 100% on room air. BMP shows glucose of 160 creatinine of 2.3 EGFR of 22. CBC shows normal white count normal platelet count hemoglobin of 9.4. POCT urine shows moderate hemoglobin nitrite negative small leukocytes many bacteria 11-20 WBCs 11-20 RBCs, culture collected. Ct shows : IMPRESSION: 1. Mild prominence of the right renal pelvis and portions of the right ureter. This may be physiologic. It could reflect the sequelae of a recently passed stone. Pyelonephritis should be considered in the proper clinical setting. 2. There is no current evidence a ureteral stone. No intrarenal stones. 3. No other evidence of an acute or recent abnormality within  the abdomen or pelvis. 4. Scattered left colon diverticula without evidence of diverticulitis. 5. Aortic atherosclerosis.    Review of Systems:  Review of Systems  Constitutional:  Negative for chills.  Genitourinary:  Positive for flank pain. Negative for dysuria and frequency.  All other systems reviewed and are negative.   Past Medical History:  Diagnosis Date   CKD (chronic kidney disease)    Diabetes mellitus (HCC)    GERD (gastroesophageal reflux disease)    HTN (hypertension)    Macular degeneration    Sarcoidosis    Spinal stenosis     Past Surgical History:  Procedure Laterality Date   APPENDECTOMY     CHOLECYSTECTOMY     COLONOSCOPY     COLONOSCOPY WITH PROPOFOL N/A 08/24/2020   Procedure: COLONOSCOPY WITH PROPOFOL;  Surgeon: Lesly Rubenstein, MD;  Location: [facility] ENDOSCOPY;  Service: Endoscopy;  Laterality: N/A;  DM   EYE SURGERY     SPINAL CORD STIMULATOR INSERTION       reports that she has quit smoking. She has never used smokeless tobacco. She reports that she does not drink alcohol and does not use drugs.  Allergies  Allergen Reactions   Penicillins     No family history on file.  Prior to Admission medications   Medication Sig Start Date End Date Taking? Authorizing Provider  aspirin 81 MG chewable tablet Chew 81 mg by mouth daily.    [provider]  atorvastatin (LIPITOR) 40 MG tablet Take 40  mg by mouth daily.    [provider]  dorzolamide-timolol (COSOPT) 22.3-6.8 MG/ML ophthalmic solution 1 drop 2 (two) times daily.    [provider]  enalapril (VASOTEC) 10 MG tablet Take 10 mg by mouth daily.    [provider]  fluticasone (FLONASE) 50 MCG/ACT nasal spray Place into both nostrils daily.    [provider]  Latanoprostene Bunod (VYZULTA) 0.024 % SOLN Apply to eye.    [provider]  levothyroxine (SYNTHROID) 125 MCG tablet Take 125 mcg by mouth daily before breakfast.    [provider]  metFORMIN (GLUMETZA) 1000 MG (MOD) 24 hr tablet Take 1,000 mg by mouth daily with breakfast.    [provider]  montelukast (SINGULAIR) 10 MG tablet Take 10 mg by mouth at bedtime.    [provider]  Netarsudil-Latanoprost 0.02-0.005 % SOLN Apply to eye.    [provider]  omeprazole (PRILOSEC) 40 MG capsule Take 40 mg by mouth daily.    [provider]  oxyCODONE-acetaminophen (PERCOCET/ROXICET) 5-325 MG tablet Take 1 tablet by mouth every 4 (four) hours as needed for severe pain. 04/15/21   Paulette Blanch, MD  potassium chloride (KLOR-CON) 10 MEQ tablet Take 10 mEq by mouth daily.    [provider]    Physical Exam: Vitals:   11/06/21 1456 11/06/21 1658 11/06/21 2121  BP: (!) 97/56 138/66 130/67  Pulse: (!) 58 (!) 57 65  Resp: '16 18 16  ' Temp: 98 F (36.7 C) 98.3 F (36.8 C) 98 F (36.7 C)  TempSrc:   Oral  SpO2: 100% 100% 98%   Physical Exam Vitals and nursing note reviewed.  Constitutional:      General: She is not in acute distress.    Appearance: Normal appearance. She is not ill-appearing, toxic-appearing or diaphoretic.  HENT:     Head: Normocephalic and atraumatic.     Right Ear: Hearing and external ear normal.     Left Ear: Hearing and external ear normal.     Nose: Nose normal. No nasal deformity.     Mouth/Throat:     Lips: Pink.     Mouth: Mucous membranes are moist.     Tongue: No lesions.     Pharynx: Oropharynx is clear.  Eyes:     Extraocular Movements: Extraocular movements intact.     Pupils: Pupils are equal, round, and reactive to light.  Neck:     Vascular: No carotid bruit.  Cardiovascular:     Rate and Rhythm: Normal rate and regular rhythm.     Pulses: Normal pulses.     Heart sounds: Normal heart sounds.  Pulmonary:     Effort: Pulmonary effort is normal.     Breath sounds: Normal breath sounds.  Abdominal:     General: Bowel sounds are normal. There is no distension.      Palpations: Abdomen is soft. There is no mass.     Tenderness: There is abdominal tenderness. There is no guarding.     Hernia: No hernia is present.    Musculoskeletal:     Right lower leg: No edema.     Left lower leg: No edema.  Skin:    General: Skin is warm.  Neurological:     General: No focal deficit present.     Mental Status: She is alert and oriented to person, place, and time.     Cranial Nerves: Cranial nerves 2-12 are intact.     Motor: Motor  function is intact.  Psychiatric:        Attention and Perception: Attention normal.        Mood and Affect: Mood normal.        Speech: Speech normal.        Behavior: Behavior normal. Behavior is cooperative.        Cognition and Memory: Cognition normal.      Labs on Admission: I have personally reviewed following labs and imaging studies BMET Recent Labs  Lab 11/06/21 1527  NA 142  K 5.0  CL 112*  CO2 21*  BUN 38*  CREATININE 2.33*  GLUCOSE 160*   Electrolytes Recent Labs  Lab 11/06/21 1527  CALCIUM 9.4   Sepsis Markers No results for input(s): "LATICACIDVEN", "PROCALCITON", "O2SATVEN" in the last 168 hours. ABG No results for input(s): "PHART", "PCO2ART", "PO2ART" in the last 168 hours. Liver Enzymes No results for input(s): "AST", "ALT", "ALKPHOS", "BILITOT", "ALBUMIN" in the last 168 hours. Cardiac Enzymes No results for input(s): "TROPONINI", "PROBNP" in the last 168 hours. No results found for: "DDIMER" Coag's No results for input(s): "APTT", "INR" in the last 168 hours.  No results found for this or any previous visit (from the past 240 hour(s)).   Current Facility-Administered Medications:    cefTRIAXone (ROCEPHIN) 1 g in sodium chloride 0.9 % 100 mL IVPB, 1 g, Intravenous, Once, Vallarie Mare M, PA-C, Last Rate: 200 mL/hr at 11/06/21 2157, 1 g at 11/06/21 2157  Current Outpatient Medications:    aspirin 81 MG chewable tablet, Chew 81 mg by mouth daily., Disp: , Rfl:    atorvastatin (LIPITOR)  40 MG tablet, Take 40 mg by mouth daily., Disp: , Rfl:    dorzolamide-timolol (COSOPT) 22.3-6.8 MG/ML ophthalmic solution, 1 drop 2 (two) times daily., Disp: , Rfl:    enalapril (VASOTEC) 10 MG tablet, Take 10 mg by mouth daily., Disp: , Rfl:    fluticasone (FLONASE) 50 MCG/ACT nasal spray, Place into both nostrils daily., Disp: , Rfl:    Latanoprostene Bunod (VYZULTA) 0.024 % SOLN, Apply to eye., Disp: , Rfl:    levothyroxine (SYNTHROID) 125 MCG tablet, Take 125 mcg by mouth daily before breakfast., Disp: , Rfl:    metFORMIN (GLUMETZA) 1000 MG (MOD) 24 hr tablet, Take 1,000 mg by mouth daily with breakfast., Disp: , Rfl:    montelukast (SINGULAIR) 10 MG tablet, Take 10 mg by mouth at bedtime., Disp: , Rfl:    Netarsudil-Latanoprost 0.02-0.005 % SOLN, Apply to eye., Disp: , Rfl:    omeprazole (PRILOSEC) 40 MG capsule, Take 40 mg by mouth daily., Disp: , Rfl:    oxyCODONE-acetaminophen (PERCOCET/ROXICET) 5-325 MG tablet, Take 1 tablet by mouth every 4 (four) hours as needed for severe pain., Disp: 30 tablet, Rfl: 0   potassium chloride (KLOR-CON) 10 MEQ tablet, Take 10 mEq by mouth daily., Disp: , Rfl:   COVID-19 Labs No results for input(s): "DDIMER", "FERRITIN", "LDH", "CRP" in the last 72 hours. Lab Results  Component Value Date   [place] NEGATIVE 04/15/2021    Radiological Exams on Admission: CT Renal Stone Study  Result Date: 11/06/2021 CLINICAL DATA:  Right-sided flank pain and vomiting today. No reported history of renal stones. EXAM: CT ABDOMEN AND PELVIS WITHOUT CONTRAST TECHNIQUE: Multidetector CT imaging of the abdomen and pelvis was performed following the standard protocol without IV contrast. RADIATION DOSE REDUCTION: This exam was performed according to the departmental dose-optimization program which includes automated exposure control, adjustment of the mA and/or kV according to patient  size and/or use of iterative reconstruction technique. COMPARISON:  None Available.  FINDINGS: Lower chest: Clear lung bases. Hepatobiliary: No focal liver abnormality is seen. Status post cholecystectomy. No biliary dilatation. Pancreas: Unremarkable. No pancreatic ductal dilatation or surrounding inflammatory changes. Spleen: Normal in size without focal abnormality. Adrenals/Urinary Tract: No adrenal masses. Mild renal cortical thinning. No renal masses and no stones. Mild prominence of the right renal pelvis and portions of the right ureter. No ureteral stone. Normal caliber left renal collecting system and ureter. Normal bladder. Stomach/Bowel: Stomach unremarkable. Small bowel and colon are normal in caliber. No wall thickening. No inflammation. Scattered left colon diverticula. Vascular/Lymphatic: Mild aortic atherosclerosis. No enlarged lymph nodes. Reproductive: Uterus and bilateral adnexa are unremarkable. Other: No abdominal wall hernia or abnormality. No abdominopelvic ascites. Musculoskeletal: No fracture or acute finding. No bone lesion. Degenerative changes of the spine. Spine stimulator leads project along the dorsal aspect of the thoracic spinal canal. IMPRESSION: 1. Mild prominence of the right renal pelvis and portions of the right ureter. This may be physiologic. It could reflect the sequelae of a recently passed stone. Pyelonephritis should be considered in the proper clinical setting. 2. There is no current evidence a ureteral stone. No intrarenal stones. 3. No other evidence of an acute or recent abnormality within the abdomen or pelvis. 4. Scattered left colon diverticula without evidence of diverticulitis. 5. Aortic atherosclerosis. Electronically Signed   By: Lajean Manes M.D.   On: 11/06/2021 18:35    EKG: Independently reviewed.  None   Assessment and Plan: Acute right flank pain Attribute to pyelonephritis. We will continue with Rocephin and follow urine culture and sensitivity. As needed Tylenol and morphine as needed for pain. Maintenance IV fluid regimen  overnight.   Acute pyelonephritis Again we will follow culture and sensitivity. Continue Rocephin daily until C/S available.   Acute renal failure superimposed on stage 4 chronic kidney disease ([place]) Lab Results  Component Value Date   CREATININE 2.33 (H) 11/06/2021   CREATININE 1.56 (H) 04/14/2021  Acute kidney injury on chronic kidney disease. Attribute to pyelonephritis. Hold ACE inhibitor therapy. Discontinue metformin. IV hydration overnight.    Type 2 diabetes mellitus without complication, without long-term current use of insulin (HCC) Home regimen of metformin discontinued. We will start patient on glycemic protocol with sliding scale insulin and Accu-Cheks.   Pulmonary sarcoidosis (HCC) No complaints of shortness of breath. Supplemental oxygen as deemed appropriate as needed for goal O2 sats of 90% and above.  Hypothyroidism Continue levothyroxine at current dose.   Hypertension, essential Blood pressure 130/67, pulse 65, temperature 98 F (36.7 C), temperature source Oral, resp. rate 16, SpO2 98 %. Blood pressure on arrival was soft so we will hold patient's enalapril. Monitor blood pressure overnight. Restart blood pressure medication as deemed appropriate.    DVT prophylaxis:  Heparin  Code Status:  Full code  Family Communication:  Norby,KEVIN (Spouse)  [phone] (Mobile)   Disposition Plan:  Home  Consults called:  None  Admission status: Observation   Para Skeans MD [facility]  6 PM- 2 AM. Please contact me via secure Chat 6 PM-2 AM. [phone] ( Pager ) To contact the [facility] Attending or Consulting provider [place] or covering provider during after hours [place], for this patient.   Check the care team in [facility] and look for a) attending/consulting [facility] provider listed and b) the [facility] team listed Log into [email or web address] and use [place]'s universal password to access. If you do  not have the password, please contact the  hospital operator. Locate the [facility] provider you are looking for under [facility] and page to a number that you can be directly reached. If you still have difficulty reaching the provider, please page the [facility] (Director on Call) for the Hospitalists listed on amion for assistance. [email or web address] 11/06/2021, 10:14 PM

## 2021-11-06 NOTE — Assessment & Plan Note (Addendum)
-  Continue home regimen Synthroid.   -Outpatient follow-up.

## 2021-11-06 NOTE — ED Notes (Signed)
Lab called Pt in middle waiting.

## 2021-11-06 NOTE — Assessment & Plan Note (Addendum)
-   Hemoglobin A1c 6.0 (11/06/2021 ) -CBG 137 this morning. -Hold home regimen oral hypoglycemic agents -Sliding scale insulin.

## 2021-11-06 NOTE — Assessment & Plan Note (Addendum)
-  On admission attributed due to concerns for acute pyelonephritis.  Also concern for possible passed stone. -CT renal stone protocol done with mild prominence of the right renal pelvis and portions of the right ureter may be physiologic, could reflect sequelae of recently passed stone.  Pyelonephritis should be considered in the proper clinical setting.  No evidence currently of ureteral stone, no intrarenal stones.  No other evidence of acute or recent abnormality within the abdomen or pelvis.  Scattered left colon diverticuli without evidence of diverticulitis.  Aortic atherosclerosis.  -Urine cultures done with insignificant growth. -Patient improving clinically and as such we will treat empirically with IV Rocephin through today and transition to oral cefadroxil tomorrow to complete a 5 to 7-day course of antibiotic treatment . -Patient with no leukocytosis, afebrile.   -Decrease IV fluids to 75 cc an hour.   -Supportive care.

## 2021-11-06 NOTE — ED Triage Notes (Signed)
Pt reports right sided back pain and vomiting x 1 that started this afternoon. Pt reports trouble urinating.

## 2021-11-06 NOTE — Assessment & Plan Note (Addendum)
Lab Results  Component Value Date   CREATININE 2.33 (H) 11/06/2021   CREATININE 1.56 (H) 04/14/2021  Acute kidney injury on chronic kidney disease. -Likely multifactorial secondary to prerenal azotemia in the setting of diuretics, ACE inhibitor and concern for acute pyelonephritis. Attribute to pyelonephritis -Continue to hold diuretics and ACE inhibitor. -Metformin on hold.  -IV fluids.  -Follow.

## 2021-11-07 ENCOUNTER — Encounter: Payer: Self-pay | Admitting: Internal Medicine

## 2021-11-07 ENCOUNTER — Observation Stay: Payer: Medicare HMO

## 2021-11-07 DIAGNOSIS — R109 Unspecified abdominal pain: Secondary | ICD-10-CM

## 2021-11-07 DIAGNOSIS — E119 Type 2 diabetes mellitus without complications: Secondary | ICD-10-CM

## 2021-11-07 DIAGNOSIS — I700 Atherosclerosis of aorta: Secondary | ICD-10-CM | POA: Diagnosis present

## 2021-11-07 DIAGNOSIS — Z7984 Long term (current) use of oral hypoglycemic drugs: Secondary | ICD-10-CM | POA: Diagnosis not present

## 2021-11-07 DIAGNOSIS — E1122 Type 2 diabetes mellitus with diabetic chronic kidney disease: Secondary | ICD-10-CM | POA: Diagnosis present

## 2021-11-07 DIAGNOSIS — T465X5A Adverse effect of other antihypertensive drugs, initial encounter: Secondary | ICD-10-CM | POA: Diagnosis present

## 2021-11-07 DIAGNOSIS — N179 Acute kidney failure, unspecified: Secondary | ICD-10-CM | POA: Diagnosis present

## 2021-11-07 DIAGNOSIS — K219 Gastro-esophageal reflux disease without esophagitis: Secondary | ICD-10-CM | POA: Diagnosis present

## 2021-11-07 DIAGNOSIS — N184 Chronic kidney disease, stage 4 (severe): Secondary | ICD-10-CM

## 2021-11-07 DIAGNOSIS — K5730 Diverticulosis of large intestine without perforation or abscess without bleeding: Secondary | ICD-10-CM | POA: Diagnosis present

## 2021-11-07 DIAGNOSIS — D860 Sarcoidosis of lung: Secondary | ICD-10-CM | POA: Diagnosis present

## 2021-11-07 DIAGNOSIS — Z79890 Hormone replacement therapy (postmenopausal): Secondary | ICD-10-CM | POA: Diagnosis not present

## 2021-11-07 DIAGNOSIS — I129 Hypertensive chronic kidney disease with stage 1 through stage 4 chronic kidney disease, or unspecified chronic kidney disease: Secondary | ICD-10-CM | POA: Diagnosis present

## 2021-11-07 DIAGNOSIS — I10 Essential (primary) hypertension: Secondary | ICD-10-CM

## 2021-11-07 DIAGNOSIS — E039 Hypothyroidism, unspecified: Secondary | ICD-10-CM

## 2021-11-07 DIAGNOSIS — Z79899 Other long term (current) drug therapy: Secondary | ICD-10-CM | POA: Diagnosis not present

## 2021-11-07 DIAGNOSIS — Z880 Allergy status to penicillin: Secondary | ICD-10-CM | POA: Diagnosis not present

## 2021-11-07 DIAGNOSIS — Z7982 Long term (current) use of aspirin: Secondary | ICD-10-CM | POA: Diagnosis not present

## 2021-11-07 DIAGNOSIS — H3530 Unspecified macular degeneration: Secondary | ICD-10-CM | POA: Diagnosis present

## 2021-11-07 DIAGNOSIS — Z87442 Personal history of urinary calculi: Secondary | ICD-10-CM | POA: Diagnosis not present

## 2021-11-07 DIAGNOSIS — N10 Acute tubulo-interstitial nephritis: Secondary | ICD-10-CM | POA: Diagnosis present

## 2021-11-07 DIAGNOSIS — T502X5A Adverse effect of carbonic-anhydrase inhibitors, benzothiadiazides and other diuretics, initial encounter: Secondary | ICD-10-CM | POA: Diagnosis present

## 2021-11-07 DIAGNOSIS — N12 Tubulo-interstitial nephritis, not specified as acute or chronic: Secondary | ICD-10-CM | POA: Diagnosis present

## 2021-11-07 LAB — COMPREHENSIVE METABOLIC PANEL WITH GFR
ALT: 15 U/L (ref 0–44)
AST: 14 U/L — ABNORMAL LOW (ref 15–41)
Albumin: 3.2 g/dL — ABNORMAL LOW (ref 3.5–5.0)
Alkaline Phosphatase: 66 U/L (ref 38–126)
Anion gap: 8 (ref 5–15)
BUN: 37 mg/dL — ABNORMAL HIGH (ref 8–23)
CO2: 20 mmol/L — ABNORMAL LOW (ref 22–32)
Calcium: 8.8 mg/dL — ABNORMAL LOW (ref 8.9–10.3)
Chloride: 114 mmol/L — ABNORMAL HIGH (ref 98–111)
Creatinine, Ser: 2.34 mg/dL — ABNORMAL HIGH (ref 0.44–1.00)
GFR, Estimated: 22 mL/min — ABNORMAL LOW
Glucose, Bld: 150 mg/dL — ABNORMAL HIGH (ref 70–99)
Potassium: 4.1 mmol/L (ref 3.5–5.1)
Sodium: 142 mmol/L (ref 135–145)
Total Bilirubin: 0.6 mg/dL (ref 0.3–1.2)
Total Protein: 6.1 g/dL — ABNORMAL LOW (ref 6.5–8.1)

## 2021-11-07 LAB — GLUCOSE, CAPILLARY
Glucose-Capillary: 143 mg/dL — ABNORMAL HIGH (ref 70–99)
Glucose-Capillary: 132 mg/dL — ABNORMAL HIGH (ref 70–99)

## 2021-11-07 LAB — CBC
HCT: 25.8 % — ABNORMAL LOW (ref 36.0–46.0)
Hemoglobin: 8 g/dL — ABNORMAL LOW (ref 12.0–15.0)
MCH: 26.1 pg (ref 26.0–34.0)
MCHC: 31 g/dL (ref 30.0–36.0)
MCV: 84.3 fL (ref 80.0–100.0)
Platelets: 200 10*3/uL (ref 150–400)
RBC: 3.06 MIL/uL — ABNORMAL LOW (ref 3.87–5.11)
RDW: 15.1 % (ref 11.5–15.5)
WBC: 3.8 10*3/uL — ABNORMAL LOW (ref 4.0–10.5)
nRBC: 0 % (ref 0.0–0.2)

## 2021-11-07 LAB — HEMOGLOBIN A1C
Hgb A1c MFr Bld: 6 % — ABNORMAL HIGH (ref 4.8–5.6)
Mean Plasma Glucose: 125.5 mg/dL

## 2021-11-07 LAB — CREATININE, URINE, RANDOM: Creatinine, Urine: 127 mg/dL

## 2021-11-07 LAB — LACTIC ACID, PLASMA: Lactic Acid, Venous: 0.7 mmol/L (ref 0.5–1.9)

## 2021-11-07 LAB — URINE CULTURE: Culture: <10000 — AB

## 2021-11-07 LAB — CBG MONITORING, ED
Glucose-Capillary: 123 mg/dL — ABNORMAL HIGH (ref 70–99)
Glucose-Capillary: 135 mg/dL — ABNORMAL HIGH (ref 70–99)
Glucose-Capillary: 137 mg/dL — ABNORMAL HIGH (ref 70–99)

## 2021-11-07 LAB — HIV ANTIBODY (ROUTINE TESTING W REFLEX): HIV Screen 4th Generation wRfx: NONREACTIVE

## 2021-11-07 LAB — SODIUM, URINE, RANDOM: Sodium, Ur: 58 mmol/L

## 2021-11-07 MED ORDER — SODIUM CHLORIDE 0.45 % IV SOLN: INTRAVENOUS | Status: DC

## 2021-11-07 MED ORDER — SODIUM CHLORIDE 0.9 % IV SOLN
2.0000 g | INTRAVENOUS | Status: AC
  Administered 2021-11-07 – 2021-11-08 (×2): 2 g via INTRAVENOUS
  Filled 2021-11-07 (×2): qty 20

## 2021-11-07 MED ORDER — SENNOSIDES-DOCUSATE SODIUM 8.6-50 MG PO TABS
1.0000 | ORAL_TABLET | Freq: Two times a day (BID) | ORAL | Status: DC
  Administered 2021-11-07 – 2021-11-09 (×5): 1 via ORAL
  Filled 2021-11-07 (×5): qty 1

## 2021-11-07 NOTE — Progress Notes (Signed)
PROGRESS NOTE    Rachael Barnes  PNT:[id number] DOB: 1951/04/09 DOA: 11/06/2021 PCP: Baxter Hire, MD    Chief Complaint  Patient presents with   Back Pain    Brief Narrative:  No notes on file    Assessment & Plan:  Principal Problem:   Pyelonephritis Active Problems:   Acute right flank pain   Acute pyelonephritis   Hypertension, essential   Hypothyroidism   Pulmonary sarcoidosis ([place])   Type 2 diabetes mellitus without complication, without long-term current use of insulin (HCC)   Acute renal failure superimposed on stage 4 chronic kidney disease (HCC)    Assessment and Plan: Acute right flank pain -On admission attributed due to concerns for acute pyelonephritis.   -CT renal stone protocol done with mild prominence of the right renal pelvis and portions of the right ureter may be physiologic, could reflect sequelae of recently passed stone.  Pyelonephritis should be considered in the proper clinical setting.  No evidence currently of ureteral stone, no intrarenal stones.  No other evidence of acute or recent abnormality within the abdomen or pelvis.  Scattered left colon diverticuli without evidence of diverticulitis.  Aortic atherosclerosis.  -Urine cultures done with insignificant growth. -We will treat empirically with IV Rocephin and if continued improvement remains afebrile with no leukocytosis could likely transition to oral antibiotics in the next 24 hours to complete a 5 to 7-day course of treatment. -Continue current pain management. -IV fluids.   Acute pyelonephritis -See above.   -Urine cultures with insignificant growth.   -Patient however presentation concerning for acute pyelonephritis versus ureteral stone.   -Continue IV antibiotics, if remains febrile in the next 24 hours, with no significant leukocytosis could likely transition to oral antibiotics and treat empirically for 5 to 7 days.    Acute renal failure superimposed on stage 4 chronic kidney  disease ([place]) Lab Results  Component Value Date   CREATININE 2.33 (H) 11/06/2021   CREATININE 1.56 (H) 04/14/2021  Acute kidney injury on chronic kidney disease. -Likely multifactorial secondary to prerenal azotemia in the setting of diuretics, ACE inhibitor and concern for acute pyelonephritis. Attribute to pyelonephritis -Continue to hold diuretics and ACE inhibitor. -Metformin on hold.  -IV fluids.  -Follow.    Type 2 diabetes mellitus without complication, without long-term current use of insulin (HCC) - Hemoglobin A1c 6.0 (11/06/2021 ) -CBG 135 this morning. -Hold home regimen oral hypoglycemic agents -Sliding scale insulin.   Pulmonary sarcoidosis (HCC) No complaints of shortness of breath. Supplemental oxygen as deemed appropriate as needed for goal O2 sats of 90% and above.  Hypothyroidism -Continue home regimen Synthroid.   -Outpatient follow-up.     Hypertension, essential Blood pressure 130/67, pulse 65, temperature 98 F (36.7 C), temperature source Oral, resp. rate 16, SpO2 98 %. Blood pressure on arrival was soft so antihypertensive medications on hold.   -Follow.         DVT prophylaxis: Heparin Code Status: Full Family Communication: Updated patient.  No family at bedside. Disposition: Likely home when clinically improved.  Status is: Inpatient The patient will require care spanning > 2 midnights and should be moved to inpatient because: Severity of illness, on IV antibiotics.   Consultants:  None  Procedures:  CT renal stone protocol 11/06/2021  Antimicrobials:  IV Rocephin 11/06/2021 >>>>>   Subjective: Laying in bed.  Denies any chest pain.  No shortness of breath.  No abdominal pain.  States right flank pain has improved.  States right flank pain  was worsening given birth.  Denies any emesis or nausea today.  States has not had any urine output since yesterday.  Objective: Vitals:   11/07/21 0700 11/07/21 1100 11/07/21 1400 11/07/21  1611  BP: (!) 133/57 120/66 (!) 118/54 127/68  Pulse: (!) 55 (!) 56 (!) 59 68  Resp: '15 20 18 18  '$ Temp:  98.5 F (36.9 C)  98.4 F (36.9 C)  TempSrc:  Oral  Oral  SpO2: 100% 100% 98% 100%    Intake/Output Summary (Last 24 hours) at 11/07/2021 2008 Last data filed at 11/07/2021 1700 Gross per 24 hour  Intake 1017.58 ml  Output 825 ml  Net 192.58 ml   There were no vitals filed for this visit.  Examination:  General exam: Appears calm and comfortable  Respiratory system: Clear to auscultation. Respiratory effort normal. Cardiovascular system: S1 & S2 heard, RRR. No JVD, murmurs, rubs, gallops or clicks. No pedal edema. Gastrointestinal system: Abdomen is nondistended, soft and nontender.  No CVA tenderness to palpation.  No organomegaly or masses felt. Normal bowel sounds heard. Central nervous system: Alert and oriented. No focal neurological deficits. Extremities: Symmetric 5 x 5 power. Skin: No rashes, lesions or ulcers Psychiatry: Judgement and insight appear normal. Mood & affect appropriate.     Data Reviewed:   CBC: Recent Labs  Lab 11/06/21 1527 11/07/21 0608  WBC 7.1 3.8*  NEUTROABS 5.9  --   HGB 9.4* 8.0*  HCT 30.8* 25.8*  MCV 85.3 84.3  PLT 234 824    Basic Metabolic Panel: Recent Labs  Lab 11/06/21 1527 11/07/21 0608  NA 142 142  K 5.0 4.1  CL 112* 114*  CO2 21* 20*  GLUCOSE 160* 150*  BUN 38* 37*  CREATININE 2.33* 2.34*  CALCIUM 9.4 8.8*    GFR: CrCl cannot be calculated (Unknown ideal weight.).  Liver Function Tests: Recent Labs  Lab 11/07/21 0608  AST 14*  ALT 15  ALKPHOS 66  BILITOT 0.6  PROT 6.1*  ALBUMIN 3.2*    CBG: Recent Labs  Lab 11/07/21 0014 11/07/21 0733 11/07/21 1211 11/07/21 1620  GLUCAP 123* 135* 137* 143*     Recent Results (from the past 240 hour(s))  Urine Culture     Status: Abnormal   Collection Time: 11/06/21  9:19 PM   Specimen: Urine, Clean Catch  Result Value Ref Range Status   Specimen  Description   Final    URINE, CLEAN CATCH Performed at [facility], [address] [place], [place] [address]    Special Requests NONE  Final   Culture (A)  Final    <10,000 COLONIES/mL INSIGNIFICANT GROWTH Performed at [facility], [place] [address], [place], [place] [address]    Report Status 11/07/2021 FINAL  Final         Radiology Studies: DG Chest 2 View  Result Date: 11/07/2021 CLINICAL DATA:  Chest pain EXAM: CHEST - 2 VIEW COMPARISON:  04/15/2019 FINDINGS: Cardiac size is within normal limits. There are no signs of pulmonary edema or focal pulmonary consolidation. There is no pleural effusion or pneumothorax. Azygous fissure is seen in right upper lung field. Pain control leads are noted in thoracic spinal canal. Surgical clips are seen in right upper quadrant pain IMPRESSION: No active cardiopulmonary disease. Electronically Signed   By: Elmer Picker M.D.   On: 11/07/2021 10:23   CT Renal Stone Study  Result Date: 11/06/2021 CLINICAL DATA:  Right-sided flank pain and vomiting today. No reported history of  renal stones. EXAM: CT ABDOMEN AND PELVIS WITHOUT CONTRAST TECHNIQUE: Multidetector CT imaging of the abdomen and pelvis was performed following the standard protocol without IV contrast. RADIATION DOSE REDUCTION: This exam was performed according to the departmental dose-optimization program which includes automated exposure control, adjustment of the mA and/or kV according to patient size and/or use of iterative reconstruction technique. COMPARISON:  None Available. FINDINGS: Lower chest: Clear lung bases. Hepatobiliary: No focal liver abnormality is seen. Status post cholecystectomy. No biliary dilatation. Pancreas: Unremarkable. No pancreatic ductal dilatation or surrounding inflammatory changes. Spleen: Normal in size without focal abnormality. Adrenals/Urinary Tract: No adrenal masses. Mild renal cortical thinning. No renal masses and no stones. Mild  prominence of the right renal pelvis and portions of the right ureter. No ureteral stone. Normal caliber left renal collecting system and ureter. Normal bladder. Stomach/Bowel: Stomach unremarkable. Small bowel and colon are normal in caliber. No wall thickening. No inflammation. Scattered left colon diverticula. Vascular/Lymphatic: Mild aortic atherosclerosis. No enlarged lymph nodes. Reproductive: Uterus and bilateral adnexa are unremarkable. Other: No abdominal wall hernia or abnormality. No abdominopelvic ascites. Musculoskeletal: No fracture or acute finding. No bone lesion. Degenerative changes of the spine. Spine stimulator leads project along the dorsal aspect of the thoracic spinal canal. IMPRESSION: 1. Mild prominence of the right renal pelvis and portions of the right ureter. This may be physiologic. It could reflect the sequelae of a recently passed stone. Pyelonephritis should be considered in the proper clinical setting. 2. There is no current evidence a ureteral stone. No intrarenal stones. 3. No other evidence of an acute or recent abnormality within the abdomen or pelvis. 4. Scattered left colon diverticula without evidence of diverticulitis. 5. Aortic atherosclerosis. Electronically Signed   By: Lajean Manes M.D.   On: 11/06/2021 18:35        Scheduled Meds:  aspirin  81 mg Oral Daily   atorvastatin  40 mg Oral Daily   dorzolamide-timolol  1 drop Both Eyes BID   fluticasone  1 spray Each Nare Daily   heparin  5,000 Units Subcutaneous Q8H   insulin aspart  0-9 Units Subcutaneous TID WC   levothyroxine  125 mcg Oral QAC breakfast   montelukast  10 mg Oral QHS   pantoprazole  40 mg Oral Daily   potassium chloride  10 mEq Oral Daily   senna-docusate  1 tablet Oral BID   sodium chloride flush  3 mL Intravenous Q12H   Continuous Infusions:  sodium chloride 75 mL/hr at 11/07/21 1155   cefTRIAXone (ROCEPHIN)  IV       LOS: 0 days    Time spent: 35 minutes    Irine Seal, MD [facility]   To contact the attending provider between 7A-7P or the covering provider during after hours 7P-7A, please log into the web site [email or web address] and access using universal [place] password for that web site. If you do not have the password, please call the hospital operator.  11/07/2021, 8:08 PM

## 2021-11-07 NOTE — Plan of Care (Signed)

## 2021-11-08 DIAGNOSIS — I10 Essential (primary) hypertension: Secondary | ICD-10-CM | POA: Diagnosis not present

## 2021-11-08 DIAGNOSIS — N179 Acute kidney failure, unspecified: Secondary | ICD-10-CM | POA: Diagnosis not present

## 2021-11-08 DIAGNOSIS — R109 Unspecified abdominal pain: Secondary | ICD-10-CM | POA: Diagnosis not present

## 2021-11-08 DIAGNOSIS — N10 Acute tubulo-interstitial nephritis: Secondary | ICD-10-CM | POA: Diagnosis not present

## 2021-11-08 LAB — CBC WITH DIFFERENTIAL/PLATELET
Abs Immature Granulocytes: 0.01 10*3/uL (ref 0.00–0.07)
Basophils Absolute: 0 10*3/uL (ref 0.0–0.1)
Basophils Relative: 0 %
Eosinophils Absolute: 0.2 10*3/uL (ref 0.0–0.5)
Eosinophils Relative: 5 %
HCT: 25.7 % — ABNORMAL LOW (ref 36.0–46.0)
Hemoglobin: 7.8 g/dL — ABNORMAL LOW (ref 12.0–15.0)
Immature Granulocytes: 0 %
Lymphocytes Relative: 31 %
Lymphs Abs: 1.1 10*3/uL (ref 0.7–4.0)
MCH: 25.8 pg — ABNORMAL LOW (ref 26.0–34.0)
MCHC: 30.4 g/dL (ref 30.0–36.0)
MCV: 85.1 fL (ref 80.0–100.0)
Monocytes Absolute: 0.3 10*3/uL (ref 0.1–1.0)
Monocytes Relative: 7 %
Neutro Abs: 2 10*3/uL (ref 1.7–7.7)
Neutrophils Relative %: 57 %
Platelets: 191 10*3/uL (ref 150–400)
RBC: 3.02 MIL/uL — ABNORMAL LOW (ref 3.87–5.11)
RDW: 15.1 % (ref 11.5–15.5)
WBC: 3.6 10*3/uL — ABNORMAL LOW (ref 4.0–10.5)
nRBC: 0 % (ref 0.0–0.2)

## 2021-11-08 LAB — RENAL FUNCTION PANEL
Albumin: 3.1 g/dL — ABNORMAL LOW (ref 3.5–5.0)
Anion gap: 6 (ref 5–15)
BUN: 34 mg/dL — ABNORMAL HIGH (ref 8–23)
CO2: 20 mmol/L — ABNORMAL LOW (ref 22–32)
Calcium: 8.9 mg/dL (ref 8.9–10.3)
Chloride: 115 mmol/L — ABNORMAL HIGH (ref 98–111)
Creatinine, Ser: 2.19 mg/dL — ABNORMAL HIGH (ref 0.44–1.00)
GFR, Estimated: 24 mL/min — ABNORMAL LOW (ref >60)
Glucose, Bld: 134 mg/dL — ABNORMAL HIGH (ref 70–99)
Phosphorus: 4.5 mg/dL (ref 2.5–4.6)
Potassium: 4.1 mmol/L (ref 3.5–5.1)
Sodium: 141 mmol/L (ref 135–145)

## 2021-11-08 LAB — GLUCOSE, CAPILLARY
Glucose-Capillary: 137 mg/dL — ABNORMAL HIGH (ref 70–99)
Glucose-Capillary: 151 mg/dL — ABNORMAL HIGH (ref 70–99)
Glucose-Capillary: 110 mg/dL — ABNORMAL HIGH (ref 70–99)
Glucose-Capillary: 131 mg/dL — ABNORMAL HIGH (ref 70–99)

## 2021-11-08 MED ORDER — CEFADROXIL 500 MG PO CAPS
500.0000 mg | ORAL_CAPSULE | Freq: Two times a day (BID) | ORAL | Status: DC
  Administered 2021-11-09: 500 mg via ORAL
  Filled 2021-11-08: qty 1

## 2021-11-08 NOTE — Progress Notes (Signed)
OT Cancellation Note  Patient Details Name: Rachael Barnes MRN: [id number] DOB: Jan 29, 1951   Cancelled Treatment:    Reason Eval/Treat Not Completed: OT screened, no needs identified, will sign off. Order received, chart reviewed. Per consult with PT, pt is able to perform transfers, ambulation, toileting, dressing with Mod I; is low fall risk; is at baseline level of fxl mobility. No assistance required with ADL/IADL, and no OT needs identified at this time. Will sign off.  Josiah Lobo 11/08/2021, 9:27 AM

## 2021-11-08 NOTE — Progress Notes (Signed)
PROGRESS NOTE    Rachael Barnes  JOA:[id number] DOB: Oct 19, 1950 DOA: 11/06/2021 PCP: Baxter Hire, MD    Chief Complaint  Patient presents with   Back Pain    Brief Narrative:  No notes on file    Assessment & Plan:  Principal Problem:   Pyelonephritis Active Problems:   Acute right flank pain   Acute pyelonephritis   Hypertension, essential   Hypothyroidism   Pulmonary sarcoidosis ([place])   Type 2 diabetes mellitus without complication, without long-term current use of insulin (HCC)   Acute renal failure superimposed on stage 4 chronic kidney disease (HCC)    Assessment and Plan: Acute right flank pain -On admission attributed due to concerns for acute pyelonephritis.  Also concern for possible passed stone. -CT renal stone protocol done with mild prominence of the right renal pelvis and portions of the right ureter may be physiologic, could reflect sequelae of recently passed stone.  Pyelonephritis should be considered in the proper clinical setting.  No evidence currently of ureteral stone, no intrarenal stones.  No other evidence of acute or recent abnormality within the abdomen or pelvis.  Scattered left colon diverticuli without evidence of diverticulitis.  Aortic atherosclerosis.  -Urine cultures done with insignificant growth. -Patient improving clinically and as such we will treat empirically with IV Rocephin through today and transition to oral cefadroxil tomorrow to complete a 5 to 7-day course of antibiotic treatment . -Patient with no leukocytosis, afebrile.   -Decrease IV fluids to 75 cc an hour.   -Supportive care.   Acute pyelonephritis -See above.   -Urine cultures with insignificant growth.   -Patient however presentation concerning for acute pyelonephritis versus ureteral stone.   -Continue IV antibiotics, if remains febrile in the next 24 hours, with no significant leukocytosis could likely transition to oral antibiotics of cefadroxil tomorrow and treat  empirically for 5 to 7 days.    Acute renal failure superimposed on stage 4 chronic kidney disease ([place]) Lab Results  Component Value Date   CREATININE 2.33 (H) 11/06/2021   CREATININE 1.56 (H) 04/14/2021  Acute kidney injury on chronic kidney disease. -Likely multifactorial secondary to prerenal azotemia in the setting of diuretics, ACE inhibitor and concern for acute pyelonephritis. -Continue to hold diuretics and ACE inhibitor. -Need to hold metformin.  -Decrease IV fluids to 75 cc an hour. -Monitor renal function. -Follow.    Type 2 diabetes mellitus without complication, without long-term current use of insulin (HCC) - Hemoglobin A1c 6.0 (11/06/2021 ) -CBG 137 this morning. -Hold home regimen oral hypoglycemic agents -Sliding scale insulin.   Pulmonary sarcoidosis (HCC) No complaints of shortness of breath. Supplemental oxygen as deemed appropriate as needed for goal O2 sats of 90% and above.  Hypothyroidism -Continue home regimen Synthroid.   -Outpatient follow-up.     Hypertension, essential Blood pressure 130/67, pulse 65, temperature 98 F (36.7 C), temperature source Oral, resp. rate 16, SpO2 98 %. Blood pressure on arrival was soft so antihypertensive medications currently on hold which we will continue for another 24 hours and if blood pressure remains stable can resume tomorrow.    -Follow.         DVT prophylaxis: Heparin Code Status: Full Family Communication: Updated patient.  Updated husband at bedside.  Disposition: Likely home when clinically improved, improvement with renal function back to baseline hopefully in the next 24 to 48 hours..  Status is: Inpatient The patient will require care spanning > 2 midnights and should be moved to inpatient because:  Severity of illness, on IV antibiotics.   Consultants:  None  Procedures:  CT renal stone protocol 11/06/2021  Antimicrobials:  IV Rocephin 11/06/2021 >>>>> 11/08/2021 Cefadroxil  11/09/2021>>>> 11/14/2021   Subjective: Sitting up in bed.  Overall feeling better.  Husband at bedside.  No chest pain.  No shortness of breath.  No abdominal pain.  No right flank pain.  States finally had urine output.   Objective: Vitals:   11/07/21 2300 11/08/21 0607 11/08/21 0804 11/08/21 1000  BP:  (!) 141/52 (!) 130/94   Pulse:  (!) 54 (!) 107   Resp:  20 19   Temp:  98.1 F (36.7 C) 98.1 F (36.7 C)   TempSrc:   Oral   SpO2:  100% 100%   Weight:    104.3 kg  Height: '5\' 5"'$  (1.651 m)       Intake/Output Summary (Last 24 hours) at 11/08/2021 1824 Last data filed at 11/08/2021 1600 Gross per 24 hour  Intake 1903.73 ml  Output --  Net 1903.73 ml   Filed Weights   11/08/21 1000  Weight: 104.3 kg    Examination:  General exam: NAD Respiratory system: CTA B.  No wheezes, no crackles, no rhonchi.  Fair air movement.  Speaking in full sentences. Cardiovascular system: Regular rate rhythm no murmurs rubs or gallops.  No JVD.  No lower extremity edema.  Gastrointestinal system: Abdomen is soft, nontender, nondistended, positive bowel sounds.  No rebound.  No guarding.  No CVA tenderness.  Central nervous system: Alert and oriented. No focal neurological deficits. Extremities: Symmetric 5 x 5 power. Skin: No rashes, lesions or ulcers Psychiatry: Judgement and insight appear normal. Mood & affect appropriate.     Data Reviewed:   CBC: Recent Labs  Lab 11/06/21 1527 11/07/21 0608 11/08/21 0435  WBC 7.1 3.8* 3.6*  NEUTROABS 5.9  --  2.0  HGB 9.4* 8.0* 7.8*  HCT 30.8* 25.8* 25.7*  MCV 85.3 84.3 85.1  PLT 234 200 161    Basic Metabolic Panel: Recent Labs  Lab 11/06/21 1527 11/07/21 0608 11/08/21 0435  NA 142 142 141  K 5.0 4.1 4.1  CL 112* 114* 115*  CO2 21* 20* 20*  GLUCOSE 160* 150* 134*  BUN 38* 37* 34*  CREATININE 2.33* 2.34* 2.19*  CALCIUM 9.4 8.8* 8.9  PHOS  --   --  4.5    GFR: Estimated Creatinine Clearance: 28.6 mL/min (A) (by C-G formula  based on SCr of 2.19 mg/dL (H)).  Liver Function Tests: Recent Labs  Lab 11/07/21 0608 11/08/21 0435  AST 14*  --   ALT 15  --   ALKPHOS 66  --   BILITOT 0.6  --   PROT 6.1*  --   ALBUMIN 3.2* 3.1*    CBG: Recent Labs  Lab 11/07/21 1620 11/07/21 2245 11/08/21 0805 11/08/21 1141 11/08/21 1654  GLUCAP 143* 132* 137* 151* 110*     Recent Results (from the past 240 hour(s))  Urine Culture     Status: Abnormal   Collection Time: 11/06/21  9:19 PM   Specimen: Urine, Clean Catch  Result Value Ref Range Status   Specimen Description   Final    URINE, CLEAN CATCH Performed at [facility], [address] [place], [place] [address]    Special Requests NONE  Final   Culture (A)  Final    <10,000 COLONIES/mL INSIGNIFICANT GROWTH Performed at [facility], [place] [address] [place], [place] [address]  Report Status 11/07/2021 FINAL  Final         Radiology Studies: DG Chest 2 View  Result Date: 11/07/2021 CLINICAL DATA:  Chest pain EXAM: CHEST - 2 VIEW COMPARISON:  04/15/2019 FINDINGS: Cardiac size is within normal limits. There are no signs of pulmonary edema or focal pulmonary consolidation. There is no pleural effusion or pneumothorax. Azygous fissure is seen in right upper lung field. Pain control leads are noted in thoracic spinal canal. Surgical clips are seen in right upper quadrant pain IMPRESSION: No active cardiopulmonary disease. Electronically Signed   By: Elmer Picker M.D.   On: 11/07/2021 10:23        Scheduled Meds:  aspirin  81 mg Oral Daily   atorvastatin  40 mg Oral Daily   [START ON 11/09/2021] cefadroxil  500 mg Oral BID   dorzolamide-timolol  1 drop Both Eyes BID   fluticasone  1 spray Each Nare Daily   heparin  5,000 Units Subcutaneous Q8H   insulin aspart  0-9 Units Subcutaneous TID WC   levothyroxine  125 mcg Oral QAC breakfast   montelukast  10 mg Oral QHS   pantoprazole  40 mg Oral Daily   potassium chloride  10  mEq Oral Daily   senna-docusate  1 tablet Oral BID   sodium chloride flush  3 mL Intravenous Q12H   Continuous Infusions:  sodium chloride 100 mL/hr at 11/08/21 1406   cefTRIAXone (ROCEPHIN)  IV Stopped (11/07/21 2234)     LOS: 1 day    Time spent: 35 minutes    Irine Seal, MD [facility]   To contact the attending provider between 7A-7P or the covering provider during after hours 7P-7A, please log into the web site [email or web address] and access using universal [place] password for that web site. If you do not have the password, please call the hospital operator.  11/08/2021, 6:24 PM

## 2021-11-08 NOTE — TOC CM/SW Note (Signed)
  Transition of Care [facility]) Screening Note   Patient Details  Name: Rachael Barnes Date of Birth: [birth year]   Transition of Care [facility]) CM/SW Contact:    Candie Chroman, LCSW Phone Number: 11/08/2021, 2:31 PM    Transition of Care Department [facility]) has reviewed patient and no TOC needs have been identified at this time. We will continue to monitor patient advancement through interdisciplinary progression rounds. If new patient transition needs arise, please place a TOC consult.

## 2021-11-08 NOTE — Plan of Care (Signed)

## 2021-11-08 NOTE — Evaluation (Signed)
Physical Therapy Evaluation Patient Details Name: Rachael Barnes MRN: [id number] DOB: 1950-05-04 Today's Date: 11/08/2021  History of Present Illness  Patient is a 71 year old with right flank pain with possible acute pyelonephritis. History of pulmonary sarcoidosis, diabetes mellitus type 2, ankle fracture.  Clinical Impression  Patient is agreeable to PT evaluation. She lives with her spouse and is independent with mobility at baseline.  Today, she is Mod I or independent for all activity. She is Mod I for bed mobility, standing from multiple surfaces, walking a lap in hallway without assistive device, and for lower body dressing and toileting. Mild dyspnea with exertion noted. No apparent acute PT needs identified at this time as patient is likely at her baseline level of functional mobility. PT will sign off at this time with no anticipated PT needs after discharge.      Recommendations for follow up therapy are one component of a multi-disciplinary discharge planning process, led by the attending physician.  Recommendations may be updated based on patient status, additional functional criteria and insurance authorization.  Follow Up Recommendations No PT follow up      Assistance Recommended at Discharge None  Patient can return home with the following       Equipment Recommendations None recommended by PT  Recommendations for Other Services       Functional Status Assessment Patient has not had a recent decline in their functional status     Precautions / Restrictions Precautions Precautions: None Restrictions Weight Bearing Restrictions: No      Mobility  Bed Mobility Overal bed mobility: Modified Independent                  Transfers Overall transfer level: Modified independent                      Ambulation/Gait Ambulation/Gait assistance: Modified independent (Device/Increase time) Gait Distance (Feet): 175 Feet Assistive device: None Gait  Pattern/deviations: Step-through pattern Gait velocity: decreased     General Gait Details: patient ambulated in room and a lap in the hallway without difficulty, without assistive device. mild dyspnea noted with activity. recommend routine short distance ambulation for conditoning  Stairs            Wheelchair Mobility    Modified Rankin (Stroke Patients Only)       Balance Overall balance assessment: Modified Independent                             High Level Balance Comments: patient able to stand leaning against wall and donn underwear, shorts, socks, and slip on shoes without loss of balance and without assistance             Pertinent Vitals/Pain Pain Assessment Pain Assessment: No/denies pain    Home Living Family/patient expects to be discharged to:: Private residence Living Arrangements: Spouse/significant other Available Help at Discharge: Family Type of Home: House Home Access: Stairs to enter   Technical brewer of Steps: 1   Home Layout: One level        Prior Function Prior Level of Function : Independent/Modified Independent                     Hand Dominance        Extremity/Trunk Assessment   Upper Extremity Assessment Upper Extremity Assessment: Overall WFL for tasks assessed    Lower Extremity Assessment Lower Extremity Assessment: Overall [facility]  for tasks assessed       Communication   Communication: No difficulties  Cognition Arousal/Alertness: Awake/alert Behavior During Therapy: WFL for tasks assessed/performed Overall Cognitive Status: Within Functional Limits for tasks assessed                                 General Comments: patient able to follow commands without difficulty        General Comments General comments (skin integrity, edema, etc.): patient completed all toileting tasks without assistance after having bowel movement    Exercises     Assessment/Plan    PT  Assessment Patient does not need any further PT services  PT Problem List         PT Treatment Interventions      PT Goals (Current goals can be found in the Care Plan section)  Acute Rehab PT Goals Patient Stated Goal: to go home as soon as possible PT Goal Formulation: All assessment and education complete, DC therapy Time For Goal Achievement: 11/08/21    Frequency       Co-evaluation               AM-PAC PT "6 Clicks" Mobility  Outcome Measure Help needed turning from your back to your side while in a flat bed without using bedrails?: None Help needed moving from lying on your back to sitting on the side of a flat bed without using bedrails?: None Help needed moving to and from a bed to a chair (including a wheelchair)?: None Help needed standing up from a chair using your arms (e.g., wheelchair or bedside chair)?: None Help needed to walk in hospital room?: None Help needed climbing 3-5 steps with a railing? : None 6 Click Score: 24    End of Session   Activity Tolerance: Patient tolerated treatment well Patient left: in bed;with call bell/phone within reach;with bed alarm set Nurse Communication: Mobility status PT Visit Diagnosis: Muscle weakness (generalized) (M62.81)    Time: 3474-2595 PT Time Calculation (min) (ACUTE ONLY): 30 min   Charges:   PT Evaluation $PT Eval Low Complexity: 1 Low          Rachael Barnes, PT, MPT   Rachael Barnes 11/08/2021, 9:42 AM

## 2021-11-09 DIAGNOSIS — N12 Tubulo-interstitial nephritis, not specified as acute or chronic: Secondary | ICD-10-CM | POA: Diagnosis not present

## 2021-11-09 LAB — CBC
HCT: 28 % — ABNORMAL LOW (ref 36.0–46.0)
Hemoglobin: 8.5 g/dL — ABNORMAL LOW (ref 12.0–15.0)
MCH: 26.1 pg (ref 26.0–34.0)
MCHC: 30.4 g/dL (ref 30.0–36.0)
MCV: 85.9 fL (ref 80.0–100.0)
Platelets: 203 10*3/uL (ref 150–400)
RBC: 3.26 MIL/uL — ABNORMAL LOW (ref 3.87–5.11)
RDW: 14.8 % (ref 11.5–15.5)
WBC: 4.5 10*3/uL (ref 4.0–10.5)
nRBC: 0 % (ref 0.0–0.2)

## 2021-11-09 LAB — RENAL FUNCTION PANEL
Albumin: 3.4 g/dL — ABNORMAL LOW (ref 3.5–5.0)
Anion gap: 6 (ref 5–15)
BUN: 31 mg/dL — ABNORMAL HIGH (ref 8–23)
CO2: 19 mmol/L — ABNORMAL LOW (ref 22–32)
Calcium: 9.1 mg/dL (ref 8.9–10.3)
Chloride: 114 mmol/L — ABNORMAL HIGH (ref 98–111)
Creatinine, Ser: 2.12 mg/dL — ABNORMAL HIGH (ref 0.44–1.00)
GFR, Estimated: 25 mL/min — ABNORMAL LOW (ref >60)
Glucose, Bld: 142 mg/dL — ABNORMAL HIGH (ref 70–99)
Phosphorus: 5.5 mg/dL — ABNORMAL HIGH (ref 2.5–4.6)
Potassium: 4 mmol/L (ref 3.5–5.1)
Sodium: 139 mmol/L (ref 135–145)

## 2021-11-09 LAB — GLUCOSE, CAPILLARY: Glucose-Capillary: 120 mg/dL — ABNORMAL HIGH (ref 70–99)

## 2021-11-09 MED ORDER — SODIUM BICARBONATE 650 MG PO TABS
650.0000 mg | ORAL_TABLET | Freq: Once | ORAL | Status: AC
Start: 2021-11-09 — End: 2021-11-09
  Administered 2021-11-09: 650 mg via ORAL
  Filled 2021-11-09: qty 1

## 2021-11-09 MED ORDER — CEFADROXIL 500 MG PO CAPS: 500.0000 mg | ORAL_CAPSULE | Freq: Two times a day (BID) | ORAL | 0 refills | Status: AC

## 2021-11-09 MED ORDER — AMLODIPINE BESYLATE 5 MG PO TABS: 5.0000 mg | ORAL_TABLET | Freq: Every day | ORAL | 0 refills | Status: DC

## 2021-11-09 NOTE — Progress Notes (Signed)
Discharge instructions were given to the pt and her family. IV site was DC. Questions were encourage and answer. Pt denies pain at this time. Belongings were given to pt.

## 2021-11-09 NOTE — Discharge Summary (Signed)
Rachael Barnes ALP:[id number] DOB: 28-Oct-1950 DOA: 11/06/2021  PCP: Baxter Hire, MD  Admit date: 11/06/2021 Discharge date: 11/09/2021  Admitted From: Home Disposition: Home  Recommendations for Outpatient Follow-up:  Follow up with PCP in 1 week Please obtain BMP/CBC in one week      Discharge Condition:Stable CODE STATUS: Full Diet recommendation: Heart Healthy Brief/Interim Summary: Per DZH:GDJM Rachael Barnes is a 71 y.o. female with history of CKD, GERD, hypertension, spinal stenosis seen in ed with complaints of right flank pain x1 day. Pain is constant, severe, associated with vomiting. Patient also reports dysuria and decreased urine output. Patient also reports chills no fever has a history of nephrolithiasis.  Acute right flank pain Acute pyelonephritis -On admission attributed due to concerns for acute pyelonephritis.   -CT renal stone protocol done see results below -Urine cultures done with insignificant growth. -Treated with IV rocephin, transitioned to po antiobiotics on discharge  Received IV fluids  Clinically better      Acute renal failure superimposed on stage 4 chronic kidney disease (Rachael Barnes Likely multifactorial secondary to prerenal azotemia in the setting of diuretics, ACE inhibitor and concern for acute pyelonephritis Creatinine now around 2.1 Will need to follow-up with PCP for monitoring Was treated with IV fluids.  Diuretics and ACE inhibitor held  Type 2 diabetes mellitus without complication, without long-term current use of insulin (HCC) - Hemoglobin A1c 6.0 (11/06/2021 ) Continue home meds, due to her GFR she will be discharged on metformin 1000 daily Follow-up with PCP for further management     Pulmonary sarcoidosis ([place]) No complaints of shortness of breath.    Hypothyroidism -Continue home regimen Synthroid.   -Outpatient follow-up.       Hypertension, essential Stable        Discharge Diagnoses:  Principal Problem:    Pyelonephritis Active Problems:   Acute right flank pain   Acute pyelonephritis   Hypertension, essential   Hypothyroidism   Pulmonary sarcoidosis (HCC)   Type 2 diabetes mellitus without complication, without long-term current use of insulin (HCC)   Acute renal failure superimposed on stage 4 chronic kidney disease [facility])    Discharge Instructions  Discharge Instructions     Call MD for:  temperature >100.4   Complete by: As directed    Diet - low sodium heart healthy   Complete by: As directed    Discharge instructions   Complete by: As directed    Take metoformin once a day since your kidneys are slower.  Need to f/u with pcp within a week and have blood work and discuss your meds For your eye medications , just resume whatever you were taking at home   Increase activity slowly   Complete by: As directed       Allergies as of 11/09/2021       Reactions   Penicillins         Medication List     STOP taking these medications    enalapril 10 MG tablet Commonly known as: VASOTEC   furosemide 40 MG tablet Commonly known as: LASIX   Klor-Con M10 10 MEQ tablet Generic drug: potassium chloride   potassium chloride 10 MEQ tablet Commonly known as: KLOR-CON       TAKE these medications    amLODipine 5 MG tablet Commonly known as: NORVASC Take 1 tablet (5 mg total) by mouth daily.   aspirin 81 MG chewable tablet Chew 81 mg by mouth daily.   atorvastatin 40 MG tablet Commonly known as: LIPITOR  Take 40 mg by mouth daily.   brimonidine 0.2 % ophthalmic solution Commonly known as: ALPHAGAN SMARTSIG:In Eye(s)   cefadroxil 500 MG capsule Commonly known as: DURICEF Take 1 capsule (500 mg total) by mouth 2 (two) times daily for 7 days.   dorzolamide-timolol 22.3-6.8 MG/ML ophthalmic solution Commonly known as: COSOPT 1 drop 2 (two) times daily.   fluticasone 50 MCG/ACT nasal spray Commonly known as: FLONASE Place into both nostrils daily.    levothyroxine 125 MCG tablet Commonly known as: SYNTHROID Take 125 mcg by mouth daily before breakfast.   metFORMIN 1000 MG (MOD) 24 hr tablet Commonly known as: GLUMETZA Take 1,000 mg by mouth daily with breakfast. What changed: Another medication with the same name was removed. Continue taking this medication, and follow the directions you see here.   montelukast 10 MG tablet Commonly known as: SINGULAIR Take 10 mg by mouth at bedtime.   Netarsudil-Latanoprost 0.02-0.005 % Soln Apply to eye.   omeprazole 40 MG capsule Commonly known as: PRILOSEC Take 40 mg by mouth daily.   oxyCODONE-acetaminophen 5-325 MG tablet Commonly known as: PERCOCET/ROXICET Take 1 tablet by mouth every 4 (four) hours as needed for severe pain.   Vyzulta 0.024 % Soln Generic drug: Latanoprostene Bunod Apply to eye.        Follow-up Information     Baxter Hire, MD Follow up in 3 day(s).   Specialty: Internal Medicine Why: needs blood work  appointment@ 11/16/2021 11:00 Contact information: [place] [address] [phone]                Allergies  Allergen Reactions   Penicillins     Consultations:    Procedures/Studies: DG Chest 2 View  Result Date: 11/07/2021 CLINICAL DATA:  Chest pain EXAM: CHEST - 2 VIEW COMPARISON:  04/15/2019 FINDINGS: Cardiac size is within normal limits. There are no signs of pulmonary edema or focal pulmonary consolidation. There is no pleural effusion or pneumothorax. Azygous fissure is seen in right upper lung field. Pain control leads are noted in thoracic spinal canal. Surgical clips are seen in right upper quadrant pain IMPRESSION: No active cardiopulmonary disease. Electronically Signed   By: Elmer Picker M.D.   On: 11/07/2021 10:23   CT Renal Stone Study  Result Date: 11/06/2021 CLINICAL DATA:  Right-sided flank pain and vomiting today. No reported history of renal stones. EXAM: CT ABDOMEN AND PELVIS WITHOUT  CONTRAST TECHNIQUE: Multidetector CT imaging of the abdomen and pelvis was performed following the standard protocol without IV contrast. RADIATION DOSE REDUCTION: This exam was performed according to the departmental dose-optimization program which includes automated exposure control, adjustment of the mA and/or kV according to patient size and/or use of iterative reconstruction technique. COMPARISON:  None Available. FINDINGS: Lower chest: Clear lung bases. Hepatobiliary: No focal liver abnormality is seen. Status post cholecystectomy. No biliary dilatation. Pancreas: Unremarkable. No pancreatic ductal dilatation or surrounding inflammatory changes. Spleen: Normal in size without focal abnormality. Adrenals/Urinary Tract: No adrenal masses. Mild renal cortical thinning. No renal masses and no stones. Mild prominence of the right renal pelvis and portions of the right ureter. No ureteral stone. Normal caliber left renal collecting system and ureter. Normal bladder. Stomach/Bowel: Stomach unremarkable. Small bowel and colon are normal in caliber. No wall thickening. No inflammation. Scattered left colon diverticula. Vascular/Lymphatic: Mild aortic atherosclerosis. No enlarged lymph nodes. Reproductive: Uterus and bilateral adnexa are unremarkable. Other: No abdominal wall hernia or abnormality. No abdominopelvic ascites. Musculoskeletal: No fracture or  acute finding. No bone lesion. Degenerative changes of the spine. Spine stimulator leads project along the dorsal aspect of the thoracic spinal canal. IMPRESSION: 1. Mild prominence of the right renal pelvis and portions of the right ureter. This may be physiologic. It could reflect the sequelae of a recently passed stone. Pyelonephritis should be considered in the proper clinical setting. 2. There is no current evidence a ureteral stone. No intrarenal stones. 3. No other evidence of an acute or recent abnormality within the abdomen or pelvis. 4. Scattered left colon  diverticula without evidence of diverticulitis. 5. Aortic atherosclerosis. Electronically Signed   By: Lajean Manes M.D.   On: 11/06/2021 18:35      Subjective: No shortness of breath, chest pain or abdominal pain  Discharge Exam: Vitals:   11/09/21 0427 11/09/21 0854  BP: (!) 128/56 (!) 141/72  Pulse: (!) 55 (!) 59  Resp: 18 16  Temp: 97.8 F (36.6 C) 98.1 F (36.7 C)  SpO2: 99% 100%   Vitals:   11/08/21 1944 11/09/21 0427 11/09/21 0500 11/09/21 0854  BP: (!) 152/66 (!) 128/56  (!) 141/72  Pulse: 64 (!) 55  (!) 59  Resp: '18 18  16  '$ Temp: 98.4 F (36.9 C) 97.8 F (36.6 C)  98.1 F (36.7 C)  TempSrc:      SpO2: 100% 99%  100%  Weight:   109 kg   Height:        General: Pt is alert, awake, not in acute distress Cardiovascular: RRR, S1/S2 +, no rubs, no gallops Respiratory: CTA bilaterally, no wheezing, no rhonchi Abdominal: Soft, NT, ND, bowel sounds + Extremities: no edema, no cyanosis    The results of significant diagnostics from this hospitalization (including imaging, microbiology, ancillary and laboratory) are listed below for reference.     Microbiology: Recent Results (from the past 240 hour(s))  Urine Culture     Status: Abnormal   Collection Time: 11/06/21  9:19 PM   Specimen: Urine, Clean Catch  Result Value Ref Range Status   Specimen Description   Final    URINE, CLEAN CATCH Performed at [facility], [address], [place], [place] [address]    Special Requests NONE  Final   Culture (A)  Final    <10,000 COLONIES/mL INSIGNIFICANT GROWTH Performed at [facility], [place] [address] [place], [place] [address]    Report Status 11/07/2021 FINAL  Final     Labs: BNP (last 3 results) No results for input(s): "BNP" in the last 8760 hours. Basic Metabolic Panel: Recent Labs  Lab 11/06/21 1527 11/07/21 0608 11/08/21 0435 11/09/21 0436  NA 142 142 141 139  K 5.0 4.1 4.1 4.0  CL 112* 114* 115* 114*  CO2 21* 20* 20* 19*   GLUCOSE 160* 150* 134* 142*  BUN 38* 37* 34* 31*  CREATININE 2.33* 2.34* 2.19* 2.12*  CALCIUM 9.4 8.8* 8.9 9.1  PHOS  --   --  4.5 5.5*   Liver Function Tests: Recent Labs  Lab 11/07/21 0608 11/08/21 0435 11/09/21 0436  AST 14*  --   --   ALT 15  --   --   ALKPHOS 66  --   --   BILITOT 0.6  --   --   PROT 6.1*  --   --   ALBUMIN 3.2* 3.1* 3.4*   Recent Labs  Lab 11/06/21 1527  LIPASE 44   No results for input(s): "AMMONIA" in the last 168 hours. CBC: Recent Labs  Lab 11/06/21 1527 11/07/21 0608 11/08/21 0435 11/09/21 0436  WBC 7.1 3.8* 3.6* 4.5  NEUTROABS 5.9  --  2.0  --   HGB 9.4* 8.0* 7.8* 8.5*  HCT 30.8* 25.8* 25.7* 28.0*  MCV 85.3 84.3 85.1 85.9  PLT 234 200 191 203   Cardiac Enzymes: No results for input(s): "CKTOTAL", "CKMB", "CKMBINDEX", "TROPONINI" in the last 168 hours. BNP: Invalid input(s): "POCBNP" CBG: Recent Labs  Lab 11/08/21 0805 11/08/21 1141 11/08/21 1654 11/08/21 2127 11/09/21 0748  GLUCAP 137* 151* 110* 131* 120*   D-Dimer No results for input(s): "DDIMER" in the last 72 hours. Hgb A1c Recent Labs    11/06/21 1527  HGBA1C 6.0*   Lipid Profile No results for input(s): "CHOL", "HDL", "LDLCALC", "TRIG", "CHOLHDL", "LDLDIRECT" in the last 72 hours. Thyroid function studies No results for input(s): "TSH", "T4TOTAL", "T3FREE", "THYROIDAB" in the last 72 hours.  Invalid input(s): "FREET3" Anemia work up No results for input(s): "VITAMINB12", "FOLATE", "FERRITIN", "TIBC", "IRON", "RETICCTPCT" in the last 72 hours. Urinalysis    Component Value Date/Time   COLORURINE YELLOW (A) 11/06/2021 2119   APPEARANCEUR CLOUDY (A) 11/06/2021 2119   LABSPEC 1.012 11/06/2021 2119   PHURINE 5.0 11/06/2021 2119   GLUCOSEU NEGATIVE 11/06/2021 2119   HGBUR MODERATE (A) 11/06/2021 2119   BILIRUBINUR NEGATIVE 11/06/2021 2119   [place] NEGATIVE 11/06/2021 2119   PROTEINUR 30 (A) 11/06/2021 2119   NITRITE NEGATIVE 11/06/2021 2119    LEUKOCYTESUR SMALL (A) 11/06/2021 2119   Sepsis Labs Recent Labs  Lab 11/06/21 1527 11/07/21 0608 11/08/21 0435 11/09/21 0436  WBC 7.1 3.8* 3.6* 4.5   Microbiology Recent Results (from the past 240 hour(s))  Urine Culture     Status: Abnormal   Collection Time: 11/06/21  9:19 PM   Specimen: Urine, Clean Catch  Result Value Ref Range Status   Specimen Description   Final    URINE, CLEAN CATCH Performed at [facility], [address], [place], [place] [address]    Special Requests NONE  Final   Culture (A)  Final    <10,000 COLONIES/mL INSIGNIFICANT GROWTH Performed at [facility], [place] [address], [place],  [address]    Report Status 11/07/2021 FINAL  Final     Time coordinating discharge: Over 30 minutes  SIGNED:   Nolberto Hanlon, MD  [facility] 11/09/2021, 3:15 PM Pager   If 7PM-7AM, please contact night-coverage [email or web address] Password [email or web address]

## 2021-11-09 NOTE — Plan of Care (Signed)

## 2022-07-20 ENCOUNTER — Other Ambulatory Visit: Payer: Self-pay | Admitting: Internal Medicine

## 2022-07-20 DIAGNOSIS — Z1231 Encounter for screening mammogram for malignant neoplasm of breast: Secondary | ICD-10-CM

## 2022-08-10 ENCOUNTER — Ambulatory Visit
Admission: RE | Admit: 2022-08-10 | Discharge: 2022-08-10 | Disposition: A | Discharge status: Home / Self Care | Payer: Medicare HMO | Source: Ambulatory Visit | Attending: Internal Medicine | Admitting: Internal Medicine

## 2022-08-10 DIAGNOSIS — Z1231 Encounter for screening mammogram for malignant neoplasm of breast: Secondary | ICD-10-CM | POA: Diagnosis not present

## 2022-08-11 ENCOUNTER — Inpatient Hospital Stay
Admission: RE | Admit: 2022-08-11 | Discharge: 2022-08-11 | Disposition: A | Discharge status: Home / Self Care | Payer: Self-pay | Source: Ambulatory Visit | Attending: Internal Medicine | Admitting: Internal Medicine

## 2022-08-11 ENCOUNTER — Other Ambulatory Visit: Payer: Self-pay | Admitting: *Deleted

## 2022-08-11 DIAGNOSIS — Z1231 Encounter for screening mammogram for malignant neoplasm of breast: Secondary | ICD-10-CM

## 2022-08-15 ENCOUNTER — Other Ambulatory Visit: Payer: Self-pay | Admitting: Internal Medicine

## 2022-08-15 DIAGNOSIS — N6489 Other specified disorders of breast: Secondary | ICD-10-CM

## 2022-08-15 DIAGNOSIS — R928 Other abnormal and inconclusive findings on diagnostic imaging of breast: Secondary | ICD-10-CM

## 2022-08-21 NOTE — H&P (View-Only) (Signed)
Referring Physician:  No referring provider defined for this encounter.  Primary Physician:  Gracelyn Nurse, MD  History of Present Illness: 08/22/2022 Rachael Barnes is here today with a chief complaint of  spinal cord stimulator is not charging.  Her battery has been evaluated by the [employer] rep who feels that it is at end-of-life.  She gets good coverage from her current placement.  Her prior stimulator was placed in 2017.   Past Surgery:   Spinal Cord Stimulator placed in 2017 by Dr. Reather Littler   The symptoms are causing a significant impact on the patient's life.   I have utilized the care everywhere function in epic to review the outside records available from external health systems.  Review of Systems:  A 10 point review of systems is negative, except for the pertinent positives and negatives detailed in the HPI.  Past Medical History: Past Medical History:  Diagnosis Date   CKD (chronic kidney disease)    Diabetes mellitus (HCC)    GERD (gastroesophageal reflux disease)    HTN (hypertension)    Macular degeneration    Sarcoidosis    Spinal stenosis     Past Surgical History: Past Surgical History:  Procedure Laterality Date   APPENDECTOMY     CHOLECYSTECTOMY     COLONOSCOPY     COLONOSCOPY WITH PROPOFOL N/A 08/24/2020   Procedure: COLONOSCOPY WITH PROPOFOL;  Surgeon: Regis Bill, MD;  Location: [facility] ENDOSCOPY;  Service: Endoscopy;  Laterality: N/A;  DM   EYE SURGERY     SPINAL CORD STIMULATOR INSERTION      Allergies: Allergies as of 08/22/2022 - Review Complete 08/22/2022  Allergen Reaction Noted   Penicillins  08/23/2020    Medications:  Current Outpatient Medications:    aspirin 81 MG chewable tablet, Chew 81 mg by mouth daily., Disp: , Rfl:    atorvastatin (LIPITOR) 40 MG tablet, Take 40 mg by mouth daily., Disp: , Rfl:    brimonidine (ALPHAGAN) 0.2 % ophthalmic solution, SMARTSIG:In Eye(s), Disp: , Rfl:     dorzolamide-timolol (COSOPT) 22.3-6.8 MG/ML ophthalmic solution, 1 drop 2 (two) times daily., Disp: , Rfl:    fluticasone (FLONASE) 50 MCG/ACT nasal spray, Place into both nostrils daily., Disp: , Rfl:    Latanoprostene Bunod (VYZULTA) 0.024 % SOLN, Apply to eye., Disp: , Rfl:    levothyroxine (SYNTHROID) 125 MCG tablet, Take 125 mcg by mouth daily before breakfast., Disp: , Rfl:    metFORMIN (GLUMETZA) 1000 MG (MOD) 24 hr tablet, Take 1,000 mg by mouth daily with breakfast., Disp: , Rfl:    montelukast (SINGULAIR) 10 MG tablet, Take 10 mg by mouth at bedtime., Disp: , Rfl:    Netarsudil-Latanoprost 0.02-0.005 % SOLN, Apply to eye., Disp: , Rfl:    omeprazole (PRILOSEC) 40 MG capsule, Take 40 mg by mouth daily., Disp: , Rfl:    oxyCODONE-acetaminophen (PERCOCET/ROXICET) 5-325 MG tablet, Take 1 tablet by mouth every 4 (four) hours as needed for severe pain., Disp: 30 tablet, Rfl: 0   amLODipine (NORVASC) 5 MG tablet, Take 1 tablet (5 mg total) by mouth daily., Disp: 30 tablet, Rfl: 0  Social History: Social History   Tobacco Use   Smoking status: Former   Smokeless tobacco: Never  Substance Use Topics   Alcohol use: Never   Drug use: Never    Family Medical History: Family History  Problem Relation Age of Onset   Breast cancer Cousin        mat  cousin    Physical Examination: Vitals:   08/22/22 1058  BP: 125/78    General: Patient is in no apparent distress. Attention to examination is appropriate.  Neck:   Supple.  Full range of motion.  Respiratory: Patient is breathing without any difficulty.   NEUROLOGICAL:     Awake, alert, oriented to person, place, and time.  Speech is clear and fluent.   Cranial Nerves: Pupils equal round and reactive to light.  Facial tone is symmetric.  Facial sensation is symmetric. Shoulder shrug is symmetric. Tongue protrusion is midline.  There is no pronator drift.  Strength: Side Biceps Triceps Deltoid Interossei Grip Wrist Ext. Wrist  Flex.  R 5 5 5 5 5 5 5   L 5 5 5 5 5 5 5    Side Iliopsoas Quads Hamstring PF DF EHL  R 5 5 5 5 5 5   L 5 5 5 5 5 5    Reflexes are 1+ and symmetric at the biceps, triceps, brachioradialis, patella and achilles.   Hoffman's is absent.   Bilateral upper and lower extremity sensation is intact to light touch.    No evidence of dysmetria noted.  Gait is normal.    IPG is in left buttock.  Medical Decision Making  Imaging: CT Renal Stone protocol reviewed to see IPG placement  I have personally reviewed the images and agree with the above interpretation.  Assessment and Plan: Ms. Rachael Barnes is a pleasant 72 y.o. female with a spinal cord stimulator with battery at end-of-life.  She is here today to discuss replacement of her internal pulse generator.  I think is reasonable to perform.  I discussed the planned procedure at length with the patient, including the risks, benefits, alternatives, and indications. The risks discussed include but are not limited to bleeding, infection, need for reoperation, spinal fluid leak, stroke, vision loss, anesthetic complication, coma, paralysis, and even death. I also described in detail that improvement was not guaranteed.  The patient expressed understanding of these risks, and asked that we proceed with surgery. I described the surgery in layman's terms, and gave ample opportunity for questions, which were answered to the best of my ability.   I spent a total of 30 minutes in this patient's care today. This time was spent reviewing pertinent records including imaging studies, obtaining and confirming history, performing a directed evaluation, formulating and discussing my recommendations, and documenting the visit within the medical record.      Thank you for involving me in the care of this patient.      Simon Llamas K. Myer Haff MD, [facility] Neurosurgery

## 2022-08-21 NOTE — Progress Notes (Unsigned)
Referring Physician:  No referring provider defined for this encounter.  Primary Physician:  Gracelyn Nurse, MD  History of Present Illness: 08/22/2022 Rachael Barnes is here today with a chief complaint of  spinal cord stimulator is not charging.  Her battery has been evaluated by the [employer] rep who feels that it is at end-of-life.  She gets good coverage from her current placement.  Her prior stimulator was placed in 2017.   Past Surgery:   Spinal Cord Stimulator placed in 2017 by Dr. Reather Littler   The symptoms are causing a significant impact on the patient's life.   I have utilized the care everywhere function in epic to review the outside records available from external health systems.  Review of Systems:  A 10 point review of systems is negative, except for the pertinent positives and negatives detailed in the HPI.  Past Medical History: Past Medical History:  Diagnosis Date   CKD (chronic kidney disease)    Diabetes mellitus (HCC)    GERD (gastroesophageal reflux disease)    HTN (hypertension)    Macular degeneration    Sarcoidosis    Spinal stenosis     Past Surgical History: Past Surgical History:  Procedure Laterality Date   APPENDECTOMY     CHOLECYSTECTOMY     COLONOSCOPY     COLONOSCOPY WITH PROPOFOL N/A 08/24/2020   Procedure: COLONOSCOPY WITH PROPOFOL;  Surgeon: Regis Bill, MD;  Location: [facility] ENDOSCOPY;  Service: Endoscopy;  Laterality: N/A;  DM   EYE SURGERY     SPINAL CORD STIMULATOR INSERTION      Allergies: Allergies as of 08/22/2022 - Review Complete 08/22/2022  Allergen Reaction Noted   Penicillins  08/23/2020    Medications:  Current Outpatient Medications:    aspirin 81 MG chewable tablet, Chew 81 mg by mouth daily., Disp: , Rfl:    atorvastatin (LIPITOR) 40 MG tablet, Take 40 mg by mouth daily., Disp: , Rfl:    brimonidine (ALPHAGAN) 0.2 % ophthalmic solution, SMARTSIG:In Eye(s), Disp: , Rfl:     dorzolamide-timolol (COSOPT) 22.3-6.8 MG/ML ophthalmic solution, 1 drop 2 (two) times daily., Disp: , Rfl:    fluticasone (FLONASE) 50 MCG/ACT nasal spray, Place into both nostrils daily., Disp: , Rfl:    Latanoprostene Bunod (VYZULTA) 0.024 % SOLN, Apply to eye., Disp: , Rfl:    levothyroxine (SYNTHROID) 125 MCG tablet, Take 125 mcg by mouth daily before breakfast., Disp: , Rfl:    metFORMIN (GLUMETZA) 1000 MG (MOD) 24 hr tablet, Take 1,000 mg by mouth daily with breakfast., Disp: , Rfl:    montelukast (SINGULAIR) 10 MG tablet, Take 10 mg by mouth at bedtime., Disp: , Rfl:    Netarsudil-Latanoprost 0.02-0.005 % SOLN, Apply to eye., Disp: , Rfl:    omeprazole (PRILOSEC) 40 MG capsule, Take 40 mg by mouth daily., Disp: , Rfl:    oxyCODONE-acetaminophen (PERCOCET/ROXICET) 5-325 MG tablet, Take 1 tablet by mouth every 4 (four) hours as needed for severe pain., Disp: 30 tablet, Rfl: 0   amLODipine (NORVASC) 5 MG tablet, Take 1 tablet (5 mg total) by mouth daily., Disp: 30 tablet, Rfl: 0  Social History: Social History   Tobacco Use   Smoking status: Former   Smokeless tobacco: Never  Substance Use Topics   Alcohol use: Never   Drug use: Never    Family Medical History: Family History  Problem Relation Age of Onset   Breast cancer Cousin        mat  cousin    Physical Examination: Vitals:   08/22/22 1058  BP: 125/78    General: Patient is in no apparent distress. Attention to examination is appropriate.  Neck:   Supple.  Full range of motion.  Respiratory: Patient is breathing without any difficulty.   NEUROLOGICAL:     Awake, alert, oriented to person, place, and time.  Speech is clear and fluent.   Cranial Nerves: Pupils equal round and reactive to light.  Facial tone is symmetric.  Facial sensation is symmetric. Shoulder shrug is symmetric. Tongue protrusion is midline.  There is no pronator drift.  Strength: Side Biceps Triceps Deltoid Interossei Grip Wrist Ext. Wrist  Flex.  R 5 5 5 5 5 5 5   L 5 5 5 5 5 5 5    Side Iliopsoas Quads Hamstring PF DF EHL  R 5 5 5 5 5 5   L 5 5 5 5 5 5    Reflexes are 1+ and symmetric at the biceps, triceps, brachioradialis, patella and achilles.   Hoffman's is absent.   Bilateral upper and lower extremity sensation is intact to light touch.    No evidence of dysmetria noted.  Gait is normal.    IPG is in left buttock.  Medical Decision Making  Imaging: CT Renal Stone protocol reviewed to see IPG placement  I have personally reviewed the images and agree with the above interpretation.  Assessment and Plan: Rachael Barnes is a pleasant 72 y.o. female with a spinal cord stimulator with battery at end-of-life.  She is here today to discuss replacement of her internal pulse generator.  I think is reasonable to perform.  I discussed the planned procedure at length with the patient, including the risks, benefits, alternatives, and indications. The risks discussed include but are not limited to bleeding, infection, need for reoperation, spinal fluid leak, stroke, vision loss, anesthetic complication, coma, paralysis, and even death. I also described in detail that improvement was not guaranteed.  The patient expressed understanding of these risks, and asked that we proceed with surgery. I described the surgery in layman's terms, and gave ample opportunity for questions, which were answered to the best of my ability.   I spent a total of 30 minutes in this patient's care today. This time was spent reviewing pertinent records including imaging studies, obtaining and confirming history, performing a directed evaluation, formulating and discussing my recommendations, and documenting the visit within the medical record.      Thank you for involving me in the care of this patient.      Tuesday Terlecki K. Myer Haff MD, [facility] Neurosurgery

## 2022-08-22 ENCOUNTER — Ambulatory Visit: Payer: Medicare HMO | Admitting: Neurosurgery

## 2022-08-22 ENCOUNTER — Other Ambulatory Visit: Payer: Self-pay

## 2022-08-22 ENCOUNTER — Encounter: Payer: Self-pay | Admitting: Neurosurgery

## 2022-08-22 VITALS — BP 125/78 | Ht 64.0 in | Wt 233.2 lb

## 2022-08-22 DIAGNOSIS — Z4542 Encounter for adjustment and management of neuropacemaker (brain) (peripheral nerve) (spinal cord): Secondary | ICD-10-CM | POA: Diagnosis not present

## 2022-08-22 DIAGNOSIS — Z01818 Encounter for other preprocedural examination: Secondary | ICD-10-CM

## 2022-08-22 NOTE — Patient Instructions (Signed)
Please see below for information in regards to your upcoming surgery:   Planned surgery: replacement of internal pulse generator ([employer])   Surgery date: 09/04/22 - you will find out your arrival time the business day before your surgery.   Pre-op appointment at [facility] Pre-admit Testing: we will call you with a date/time for this. Pre-admit testing is located on the first floor of the [facility] building, [address] [facility] [address], [address]. Please bring all prescriptions in the original prescription bottles to your appointment, even if you have reviewed medications by phone with a pharmacy representative. Pre-op labs may be done at your pre-op appointment. You are not required to fast for these labs. Should you need to change your pre-op appointment, please call Pre-admit testing at [phone].    Blood thinners: Aspirin:  stop aspirin 7 days prior, resume aspirin 14 days after   Diabetes: Ozempic: hold for 7 days prior to surgery Metformin: hold for 2 days prior to surgery    We can be reached by phone or mychart 8am-4pm, Monday-Friday. If you have any questions/concerns before or after surgery, you can reach [place] at [phone], or you can send a mychart message. If you have a concern after hours that cannot wait until normal business hours, you can call [phone] and ask to page the neurosurgeon on call for [place].    Appointments/FMLA & disability paperwork: [employer]ty [employer] Cristin  Nurse: Royston Cowper  Medical assistants: Laurann Montana Physician Assistant's: Manning Charity & Drake Leach Surgeon: Venetia Night, MD

## 2022-08-23 ENCOUNTER — Ambulatory Visit
Admission: RE | Admit: 2022-08-23 | Discharge: 2022-08-23 | Disposition: A | Discharge status: Home / Self Care | Payer: Medicare HMO | Source: Ambulatory Visit | Attending: Internal Medicine | Admitting: Internal Medicine

## 2022-08-23 DIAGNOSIS — N6489 Other specified disorders of breast: Secondary | ICD-10-CM | POA: Insufficient documentation

## 2022-08-23 DIAGNOSIS — R928 Other abnormal and inconclusive findings on diagnostic imaging of breast: Secondary | ICD-10-CM | POA: Diagnosis not present

## 2022-08-24 ENCOUNTER — Telehealth: Payer: Self-pay

## 2022-08-24 ENCOUNTER — Other Ambulatory Visit: Payer: Self-pay | Admitting: Internal Medicine

## 2022-08-24 NOTE — Telephone Encounter (Signed)
Due to time constraints and the need to add on a time sensitive case for 09/04/22, I contacted Rachael Barnes and asked her to move her surgery from 09/04/22 to 09/06/22. She was agreeable to this. I thanked her for her understanding.

## 2022-08-28 ENCOUNTER — Other Ambulatory Visit: Payer: Medicare HMO

## 2022-08-30 ENCOUNTER — Encounter
Admission: RE | Admit: 2022-08-30 | Discharge: 2022-08-30 | Disposition: A | Discharge status: Home / Self Care | Payer: Medicare HMO | Source: Ambulatory Visit | Attending: Neurosurgery | Admitting: Neurosurgery

## 2022-08-30 DIAGNOSIS — Z01810 Encounter for preprocedural cardiovascular examination: Secondary | ICD-10-CM

## 2022-08-30 DIAGNOSIS — D860 Sarcoidosis of lung: Secondary | ICD-10-CM | POA: Diagnosis not present

## 2022-08-30 DIAGNOSIS — I10 Essential (primary) hypertension: Secondary | ICD-10-CM | POA: Insufficient documentation

## 2022-08-30 DIAGNOSIS — Z01818 Encounter for other preprocedural examination: Secondary | ICD-10-CM | POA: Diagnosis present

## 2022-08-30 DIAGNOSIS — E119 Type 2 diabetes mellitus without complications: Secondary | ICD-10-CM | POA: Insufficient documentation

## 2022-08-30 DIAGNOSIS — Z01812 Encounter for preprocedural laboratory examination: Secondary | ICD-10-CM

## 2022-08-30 HISTORY — DX: Personal history of other diseases of the nervous system and sense organs: Z86.69

## 2022-08-30 HISTORY — DX: Acute kidney failure, unspecified: N18.4

## 2022-08-30 HISTORY — DX: Hypothyroidism, unspecified: E03.9

## 2022-08-30 HISTORY — DX: Chronic kidney disease, stage 4 (severe): N17.9

## 2022-08-30 HISTORY — DX: Anemia, unspecified: D64.9

## 2022-08-30 HISTORY — DX: Bronchitis, not specified as acute or chronic: J40

## 2022-08-30 HISTORY — DX: Obstructive sleep apnea (adult) (pediatric): G47.33

## 2022-08-30 HISTORY — DX: Sarcoidosis of lung: D86.0

## 2022-08-30 HISTORY — DX: Dyspnea, unspecified: R06.00

## 2022-08-30 HISTORY — DX: Type 2 diabetes mellitus without complications: E11.9

## 2022-08-30 HISTORY — DX: Obesity, unspecified: E66.9

## 2022-08-30 HISTORY — DX: Acute pyelonephritis: N10

## 2022-08-30 HISTORY — DX: Unspecified glaucoma: H40.9

## 2022-08-30 LAB — CBC
HCT: 33.6 % — ABNORMAL LOW (ref 36.0–46.0)
Hemoglobin: 10.7 g/dL — ABNORMAL LOW (ref 12.0–15.0)
MCH: 25 pg — ABNORMAL LOW (ref 26.0–34.0)
MCHC: 31.8 g/dL (ref 30.0–36.0)
MCV: 78.5 fL — ABNORMAL LOW (ref 80.0–100.0)
Platelets: 280 10*3/uL (ref 150–400)
RBC: 4.28 MIL/uL (ref 3.87–5.11)
RDW: 14.8 % (ref 11.5–15.5)
WBC: 5.3 10*3/uL (ref 4.0–10.5)
nRBC: 0 % (ref 0.0–0.2)

## 2022-08-30 LAB — SURGICAL PCR SCREEN
MRSA, PCR: NEGATIVE
Staphylococcus aureus: NEGATIVE

## 2022-08-30 LAB — BASIC METABOLIC PANEL
Anion gap: 11 (ref 5–15)
BUN: 32 mg/dL — ABNORMAL HIGH (ref 8–23)
CO2: 20 mmol/L — ABNORMAL LOW (ref 22–32)
Calcium: 9.6 mg/dL (ref 8.9–10.3)
Chloride: 107 mmol/L (ref 98–111)
Creatinine, Ser: 1.63 mg/dL — ABNORMAL HIGH (ref 0.44–1.00)
GFR, Estimated: 34 mL/min — ABNORMAL LOW (ref >60)
Glucose, Bld: 166 mg/dL — ABNORMAL HIGH (ref 70–99)
Potassium: 4 mmol/L (ref 3.5–5.1)
Sodium: 138 mmol/L (ref 135–145)

## 2022-08-30 NOTE — Patient Instructions (Addendum)
Your procedure is scheduled on:09-06-22 Wednesday Report to the Registration Desk on the 1st floor of the [facility].Then proceed to the 2nd floor Surgery Desk To find out your arrival time, please call [phone] between 1PM - 3PM on:09-05-22 Tuesday If your arrival time is 6:00 am, do not arrive before that time as the [facility] entrance doors do not open until 6:00 am.  REMEMBER: Instructions that are not followed completely may result in serious medical risk, up to and including death; or upon the discretion of your surgeon and anesthesiologist your surgery may need to be rescheduled.  Do not eat food after midnight the night before surgery.  No gum chewing or hard candies.  You may however, drink Water up to 2 hours before you are scheduled to arrive for your surgery. Do not drink anything within 2 hours of your scheduled arrival time  One week prior to surgery: Stop Anti-inflammatories (NSAIDS) such as Advil, Aleve, Ibuprofen, Motrin, Naproxen, Naprosyn and Aspirin based products such as Excedrin, Goody's Powder, BC Powder.You may however, take Tylenol if needed for pain up until the day of surgery. Stop ANY OVER THE COUNTER supplements/vitamins NOW (08-30-22) until after surgery (Vitamin D3)   Continue taking all prescribed medications with the exception of the following: -Stop your 81 mg Aspirin 7 days prior to surgery-Last dose was on 08-28-22 Monday -Stop your Semaglutide (Ozempic) 7 days prior to surgery-Last dose was on 08-23-22  Do NOT take again until AFTER your surgery -Stop your metFORMIN (GLUMETZA 2 days prior to surgery-Last dose will be on 09-03-22 Sunday  TAKE ONLY THESE MEDICATIONS THE MORNING OF SURGERY WITH A SIP OF WATER: -amLODipine (NORVASC)  -levothyroxine (SYNTHROID)  -omeprazole (PRILOSEC) -cetirizine (ZYRTEC)  -potassium chloride (KLOR-CON)   Use your albuterol (VENTOLIN HFA) nebulizer the day of surgery and bring your Albuterol Inhaler to the  hospital  No Alcohol for 24 hours before or after surgery.  No Smoking including e-cigarettes for 24 hours before surgery.  No chewable tobacco products for at least 6 hours before surgery.  No nicotine patches on the day of surgery.  Do not use any "recreational" drugs for at least a week (preferably 2 weeks) before your surgery.  Please be advised that the combination of cocaine and anesthesia may have negative outcomes, up to and including death. If you test positive for cocaine, your surgery will be cancelled.  On the morning of surgery brush your teeth with toothpaste and water, you may rinse your mouth with mouthwash if you wish. Do not swallow any toothpaste or mouthwash.  Use CHG Soap as directed on instruction sheet.  Bring your C-Pap machine to the hospital  Do not wear jewelry, make-up, hairpins, clips or nail polish.  Do not wear lotions, powders, or perfumes.   Do not shave body hair from the neck down 48 hours before surgery.  Contact lenses, hearing aids and dentures may not be worn into surgery.  Do not bring valuables to the hospital. [facility] is not responsible for any missing/lost belongings or valuables.   Notify your doctor if there is any change in your medical condition (cold, fever, infection).  Wear comfortable clothing (specific to your surgery type) to the hospital.  After surgery, you can help prevent lung complications by doing breathing exercises.  Take deep breaths and cough every 1-2 hours. Your doctor may order a device called an Incentive Spirometer to help you take deep breaths. When coughing or sneezing, hold a pillow firmly against your  incision with both hands. This is called "splinting." Doing this helps protect your incision. It also decreases belly discomfort.  If you are being admitted to the hospital overnight, leave your suitcase in the car. After surgery it may be brought to your room.  In case of increased patient census, it may  be necessary for you, the patient, to continue your postoperative care in the Same Day Surgery department.  If you are being discharged the day of surgery, you will not be allowed to drive home. You will need a responsible individual to drive you home and stay with you for 24 hours after surgery.   If you are taking public transportation, you will need to have a responsible individual with you.  Please call the Pre-admissions Testing Dept. at [phone] if you have any questions about these instructions.  Surgery Visitation Policy:  Patients having surgery or a procedure may have two visitors.  Children under the age of 96 must have an adult with them who is not the patient.  Inpatient Visitation:    Visiting hours are 7 a.m. to 8 p.m. Up to four visitors are allowed at one time in a patient room. The visitors may rotate out with other people during the day.  One visitor age 36 or older may stay with the patient overnight and must be in the room by 8 p.m.    Pre-operative 5 CHG Bath Instructions   You can play a key role in reducing the risk of infection after surgery. Your skin needs to be as free of germs as possible. You can reduce the number of germs on your skin by washing with CHG (chlorhexidine gluconate) soap before surgery. CHG is an antiseptic soap that kills germs and continues to kill germs even after washing.   DO NOT use if you have an allergy to chlorhexidine/CHG or antibacterial soaps. If your skin becomes reddened or irritated, stop using the CHG and notify one of our RNs at [phone].   Please shower with the CHG soap starting 4 days before surgery using the following schedule:     Please keep in mind the following:  DO NOT shave, including legs and underarms, starting the day of your first shower.   You may shave your face at any point before/day of surgery.  Place clean sheets on your bed the day you start using CHG soap. Use a clean washcloth (not used  since being washed) for each shower. DO NOT sleep with pets once you start using the CHG.   CHG Shower Instructions:  If you choose to wash your hair and private area, wash first with your normal shampoo/soap.  After you use shampoo/soap, rinse your hair and body thoroughly to remove shampoo/soap residue.  Turn the water OFF and apply about 3 tablespoons (45 ml) of CHG soap to a CLEAN washcloth.  Apply CHG soap ONLY FROM YOUR NECK DOWN TO YOUR TOES (washing for 3-5 minutes)  DO NOT use CHG soap on face, private areas, open wounds, or sores.  Pay special attention to the area where your surgery is being performed.  If you are having back surgery, having someone wash your back for you may be helpful. Wait 2 minutes after CHG soap is applied, then you may rinse off the CHG soap.  Pat dry with a clean towel  Put on clean clothes/pajamas   If you choose to wear lotion, please use ONLY the CHG-compatible lotions on the back of this paper.  Additional instructions for the day of surgery: DO NOT APPLY any lotions, deodorants, cologne, or perfumes.   Put on clean/comfortable clothes.  Brush your teeth.  Ask your nurse before applying any prescription medications to the skin.      CHG Compatible Lotions   Aveeno Moisturizing lotion  Cetaphil Moisturizing Cream  Cetaphil Moisturizing Lotion  Clairol Herbal Essence Moisturizing Lotion, Dry Skin  Clairol Herbal Essence Moisturizing Lotion, Extra Dry Skin  Clairol Herbal Essence Moisturizing Lotion, Normal Skin  Curel Age Defying Therapeutic Moisturizing Lotion with Alpha Hydroxy  Curel Extreme Care Body Lotion  Curel Soothing Hands Moisturizing Hand Lotion  Curel Therapeutic Moisturizing Cream, Fragrance-Free  Curel Therapeutic Moisturizing Lotion, Fragrance-Free  Curel Therapeutic Moisturizing Lotion, Original Formula  Eucerin Daily Replenishing Lotion  Eucerin Dry Skin Therapy Plus Alpha Hydroxy Crme  Eucerin Dry Skin Therapy Plus  Alpha Hydroxy Lotion  Eucerin Original Crme  Eucerin Original Lotion  Eucerin Plus Crme Eucerin Plus Lotion  Eucerin TriLipid Replenishing Lotion  Keri Anti-Bacterial Hand Lotion  Keri Deep Conditioning Original Lotion Dry Skin Formula Softly Scented  Keri Deep Conditioning Original Lotion, Fragrance Free Sensitive Skin Formula  Keri Lotion Fast Absorbing Fragrance Free Sensitive Skin Formula  Keri Lotion Fast Absorbing Softly Scented Dry Skin Formula  Keri Original Lotion  Keri Skin Renewal Lotion Keri Silky Smooth Lotion  Keri Silky Smooth Sensitive Skin Lotion  Nivea Body Creamy Conditioning Oil  Nivea Body Extra Enriched Teacher, adult education Moisturizing Lotion Nivea Crme  Nivea Skin Firming Lotion  NutraDerm 30 Skin Lotion  NutraDerm Skin Lotion  NutraDerm Therapeutic Skin Cream  NutraDerm Therapeutic Skin Lotion  ProShield Protective Hand Cream  Provon moisturizing lotion

## 2022-08-31 ENCOUNTER — Encounter
Admission: RE | Admit: 2022-08-31 | Discharge: 2022-08-31 | Disposition: A | Discharge status: Home / Self Care | Payer: Medicare HMO | Source: Ambulatory Visit | Attending: Neurosurgery | Admitting: Neurosurgery

## 2022-08-31 ENCOUNTER — Other Ambulatory Visit: Payer: Self-pay | Admitting: Internal Medicine

## 2022-08-31 DIAGNOSIS — N63 Unspecified lump in unspecified breast: Secondary | ICD-10-CM

## 2022-08-31 DIAGNOSIS — I10 Essential (primary) hypertension: Secondary | ICD-10-CM

## 2022-08-31 DIAGNOSIS — Z01818 Encounter for other preprocedural examination: Secondary | ICD-10-CM

## 2022-09-04 NOTE — Progress Notes (Signed)
  Perioperative Services Pre-Admission/Anesthesia Testing    Date: 09/04/22  Name: Rachael Barnes MRN:   [id number]  Re: GLP-1 clearance and provider recommendations   Planned Surgical Procedure(s):    Case: [id number] Date/Time: 09/06/22 0700   Procedure: REPLACE INTERNAL PULSE GENERATOR ([employer]) - local with MAC   Anesthesia type: Monitor Anesthesia Care   Pre-op diagnosis: Z45.42 Battery end of life of spinal cord stimulator   Location: [facility] OR ROOM 03 / [facility]   Surgeons: Venetia Night, MD      Clinical Notes:  Patient is scheduled for the above procedure with the indicated provider/surgeon. In review of her medication reconciliation it was noted that patient is on a prescribed GLP-1 medication. Per guidelines issued by the American Society of Anesthesiologists (ASA), it is recommended that these medications be held for 7 days prior to the patient undergoing any type of elective surgical procedure. The patient is taking the following GLP-1 medication:  [x]  SEMAGLUTIDE   []  EXENATIDE  []  LIRAGLUTIDE   []  LIXISENATIDE  []  DULAGLUTIDE     []  TIRZEPATIDE (GLP-1/GIP)  Reached out to prescribing provider Letitia Libra, MD) to make them aware of the guidelines from anesthesia. Given that this patient takes the prescribed GLP-1 medication for her  diabetes diagnosis, rather than for weight loss, recommendations from the prescribing provider were solicited. Prescribing provider made aware of the following so that informed decision/POC can be developed for this patient that may be taking medications belonging to these drug classes:  Oral GLP-1 medications will be held 1 day prior to surgery.  Injectable GLP-1 medications will be held 7 days prior to surgery.  Metformin is routinely held 48 hours prior to surgery due to renal concerns, potential need for contrasted imaging perioperatively, and the potential for tissue hypoxia leading to drug induced lactic  acidosis.  All SGLT2i medications are held 72 hours prior to surgery as they can be associated with the increased potential for developing euglycemic diabetic ketoacidosis (EDKA).   Impression and Plan:  Rachael Barnes is on a prescribed GLP-1 medication, which induces the known side effect of decreased gastric emptying. Efforts are bring made to mitigate the risk of perioperative hyperglycemic events, as elevated blood glucose levels have been found to contribute to intra/postoperative complications. Additionally, hyperglycemic extremes can potentially necessitate the postponing of a patient's elective case in order to better optimize perioperative glycemic control, again with the aforementioned guidelines in place. With this in mind, recommendations have been sought from the prescribing provider, who has cleared patient to proceed with holding the prescribed GLP-1 as per the guidelines from the ASA.   Provider recommending: no further recommendations received from the prescribing provider.  Copy of signed clearance and recommendations placed on patient's chart for inclusion in their medical record and for review by the surgical/anesthetic team on the day of her procedure.   Quentin Mulling, MSN, APRN, FNP-C, CEN [facility]  Peri-operative Services Nurse Practitioner Phone: [phone] 09/04/22 5:25 PM  NOTE: This note has been prepared using Dragon dictation software. Despite my best ability to proofread, there is always the potential that unintentional transcriptional errors may still occur from this process.

## 2022-09-05 MED ORDER — SODIUM CHLORIDE 0.9 % IV SOLN: INTRAVENOUS | Status: DC

## 2022-09-05 MED ORDER — CEFAZOLIN SODIUM-DEXTROSE 2-4 GM/100ML-% IV SOLN
2.0000 g | INTRAVENOUS | Status: AC
  Administered 2022-09-06: 2 g via INTRAVENOUS

## 2022-09-05 MED ORDER — CEFAZOLIN IN SODIUM CHLORIDE 2-0.9 GM/100ML-% IV SOLN
2.0000 g | Freq: Once | INTRAVENOUS | Status: DC
  Filled 2022-09-05: qty 100

## 2022-09-05 MED ORDER — CHLORHEXIDINE GLUCONATE 0.12 % MT SOLN
15.0000 mL | Freq: Once | OROMUCOSAL | Status: AC
  Administered 2022-09-06: 15 mL via OROMUCOSAL

## 2022-09-05 MED ORDER — VANCOMYCIN HCL IN DEXTROSE 1-5 GM/200ML-% IV SOLN
1000.0000 mg | Freq: Once | INTRAVENOUS | Status: AC
  Administered 2022-09-06: 1000 mg via INTRAVENOUS

## 2022-09-05 MED ORDER — ORAL CARE MOUTH RINSE: 15.0000 mL | Freq: Once | OROMUCOSAL | Status: AC

## 2022-09-06 ENCOUNTER — Other Ambulatory Visit: Payer: Self-pay

## 2022-09-06 ENCOUNTER — Encounter
Admission: RE | Disposition: A | Discharge status: Home / Self Care | Payer: Self-pay | Source: Home / Self Care | Attending: Neurosurgery

## 2022-09-06 ENCOUNTER — Ambulatory Visit: Payer: Medicare HMO | Admitting: Urgent Care

## 2022-09-06 ENCOUNTER — Encounter: Payer: Self-pay | Admitting: Neurosurgery

## 2022-09-06 ENCOUNTER — Ambulatory Visit
Admission: RE | Admit: 2022-09-06 | Discharge: 2022-09-06 | Disposition: A | Discharge status: Home / Self Care | Payer: Medicare HMO | Attending: Neurosurgery | Admitting: Neurosurgery

## 2022-09-06 DIAGNOSIS — G4730 Sleep apnea, unspecified: Secondary | ICD-10-CM | POA: Diagnosis not present

## 2022-09-06 DIAGNOSIS — N189 Chronic kidney disease, unspecified: Secondary | ICD-10-CM | POA: Insufficient documentation

## 2022-09-06 DIAGNOSIS — Z7985 Long-term (current) use of injectable non-insulin antidiabetic drugs: Secondary | ICD-10-CM | POA: Insufficient documentation

## 2022-09-06 DIAGNOSIS — I129 Hypertensive chronic kidney disease with stage 1 through stage 4 chronic kidney disease, or unspecified chronic kidney disease: Secondary | ICD-10-CM | POA: Diagnosis not present

## 2022-09-06 DIAGNOSIS — J45909 Unspecified asthma, uncomplicated: Secondary | ICD-10-CM | POA: Insufficient documentation

## 2022-09-06 DIAGNOSIS — Z01818 Encounter for other preprocedural examination: Secondary | ICD-10-CM

## 2022-09-06 DIAGNOSIS — Z7984 Long term (current) use of oral hypoglycemic drugs: Secondary | ICD-10-CM | POA: Insufficient documentation

## 2022-09-06 DIAGNOSIS — E039 Hypothyroidism, unspecified: Secondary | ICD-10-CM | POA: Insufficient documentation

## 2022-09-06 DIAGNOSIS — Z87891 Personal history of nicotine dependence: Secondary | ICD-10-CM | POA: Diagnosis not present

## 2022-09-06 DIAGNOSIS — Z4542 Encounter for adjustment and management of neuropacemaker (brain) (peripheral nerve) (spinal cord): Secondary | ICD-10-CM

## 2022-09-06 DIAGNOSIS — K219 Gastro-esophageal reflux disease without esophagitis: Secondary | ICD-10-CM | POA: Diagnosis not present

## 2022-09-06 DIAGNOSIS — E1122 Type 2 diabetes mellitus with diabetic chronic kidney disease: Secondary | ICD-10-CM | POA: Diagnosis not present

## 2022-09-06 HISTORY — PX: SPINAL CORD STIMULATOR BATTERY EXCHANGE: SHX6202

## 2022-09-06 LAB — GLUCOSE, CAPILLARY
Glucose-Capillary: 164 mg/dL — ABNORMAL HIGH (ref 70–99)
Glucose-Capillary: 220 mg/dL — ABNORMAL HIGH (ref 70–99)

## 2022-09-06 SURGERY — SPINAL CORD STIMULATOR BATTERY EXCHANGE
Anesthesia: General | Site: Back

## 2022-09-06 MED ORDER — PROPOFOL 500 MG/50ML IV EMUL
INTRAVENOUS | Status: DC | PRN
  Administered 2022-09-06: 25 ug/kg/min via INTRAVENOUS

## 2022-09-06 MED ORDER — LIDOCAINE HCL (PF) 2 % IJ SOLN
INTRAMUSCULAR | Status: AC
  Filled 2022-09-06: qty 5

## 2022-09-06 MED ORDER — BUPIVACAINE HCL (PF) 0.5 % IJ SOLN
INTRAMUSCULAR | Status: AC
  Filled 2022-09-06: qty 60

## 2022-09-06 MED ORDER — KETAMINE HCL 10 MG/ML IJ SOLN
INTRAMUSCULAR | Status: DC | PRN
  Administered 2022-09-06 (×2): 10 mg via INTRAVENOUS

## 2022-09-06 MED ORDER — 0.9 % SODIUM CHLORIDE (POUR BTL) OPTIME
TOPICAL | Status: DC | PRN
  Administered 2022-09-06: 500 mL

## 2022-09-06 MED ORDER — BUPIVACAINE-EPINEPHRINE (PF) 0.5% -1:200000 IJ SOLN
INTRAMUSCULAR | Status: DC | PRN
  Administered 2022-09-06: 30 mL

## 2022-09-06 MED ORDER — KETAMINE HCL 50 MG/5ML IJ SOSY
PREFILLED_SYRINGE | INTRAMUSCULAR | Status: AC
  Filled 2022-09-06: qty 5

## 2022-09-06 MED ORDER — EPINEPHRINE PF 1 MG/ML IJ SOLN
INTRAMUSCULAR | Status: AC
  Filled 2022-09-06: qty 1

## 2022-09-06 MED ORDER — MIDAZOLAM HCL 2 MG/2ML IJ SOLN
INTRAMUSCULAR | Status: AC
  Filled 2022-09-06: qty 2

## 2022-09-06 MED ORDER — BUPIVACAINE LIPOSOME 1.3 % IJ SUSP
INTRAMUSCULAR | Status: AC
  Filled 2022-09-06: qty 20

## 2022-09-06 MED ORDER — VANCOMYCIN HCL IN DEXTROSE 1-5 GM/200ML-% IV SOLN
INTRAVENOUS | Status: AC
  Filled 2022-09-06: qty 200

## 2022-09-06 MED ORDER — PROPOFOL 1000 MG/100ML IV EMUL
INTRAVENOUS | Status: AC
  Filled 2022-09-06: qty 100

## 2022-09-06 MED ORDER — OXYCODONE HCL 5 MG/5ML PO SOLN: 5.0000 mg | Freq: Once | ORAL | Status: DC | PRN

## 2022-09-06 MED ORDER — ONDANSETRON HCL 4 MG/2ML IJ SOLN
INTRAMUSCULAR | Status: DC | PRN
  Administered 2022-09-06: 4 mg via INTRAVENOUS

## 2022-09-06 MED ORDER — INSULIN ASPART 100 UNIT/ML IJ SOLN
5.0000 [IU] | Freq: Once | INTRAMUSCULAR | Status: AC
  Administered 2022-09-06: 5 [IU] via SUBCUTANEOUS

## 2022-09-06 MED ORDER — FENTANYL CITRATE (PF) 100 MCG/2ML IJ SOLN
INTRAMUSCULAR | Status: AC
  Filled 2022-09-06: qty 2

## 2022-09-06 MED ORDER — LIDOCAINE HCL (CARDIAC) PF 100 MG/5ML IV SOSY
PREFILLED_SYRINGE | INTRAVENOUS | Status: DC | PRN
  Administered 2022-09-06: 20 mg via INTRAVENOUS

## 2022-09-06 MED ORDER — CHLORHEXIDINE GLUCONATE 0.12 % MT SOLN
OROMUCOSAL | Status: AC
  Filled 2022-09-06: qty 15

## 2022-09-06 MED ORDER — MIDAZOLAM HCL 2 MG/2ML IJ SOLN
INTRAMUSCULAR | Status: DC | PRN
  Administered 2022-09-06 (×2): 1 mg via INTRAVENOUS

## 2022-09-06 MED ORDER — ONDANSETRON HCL 4 MG/2ML IJ SOLN
INTRAMUSCULAR | Status: AC
  Filled 2022-09-06: qty 2

## 2022-09-06 MED ORDER — OXYCODONE HCL 5 MG PO TABS: 5.0000 mg | ORAL_TABLET | Freq: Once | ORAL | Status: DC | PRN

## 2022-09-06 MED ORDER — OXYCODONE HCL 5 MG PO TABS: 5.0000 mg | ORAL_TABLET | Freq: Three times a day (TID) | ORAL | 0 refills | Status: DC | PRN

## 2022-09-06 MED ORDER — CEFAZOLIN SODIUM-DEXTROSE 2-4 GM/100ML-% IV SOLN
INTRAVENOUS | Status: AC
  Filled 2022-09-06: qty 100

## 2022-09-06 MED ORDER — FENTANYL CITRATE (PF) 100 MCG/2ML IJ SOLN
INTRAMUSCULAR | Status: DC | PRN
  Administered 2022-09-06 (×4): 25 ug via INTRAVENOUS

## 2022-09-06 MED ORDER — FENTANYL CITRATE (PF) 100 MCG/2ML IJ SOLN: 25.0000 ug | INTRAMUSCULAR | Status: DC | PRN

## 2022-09-06 MED ORDER — INSULIN ASPART 100 UNIT/ML IJ SOLN
INTRAMUSCULAR | Status: AC
  Filled 2022-09-06: qty 1

## 2022-09-06 SURGICAL SUPPLY — 47 items
ADH SKN CLS APL DERMABOND .7 (GAUZE/BANDAGES/DRESSINGS) ×1
AGENT HMST KT MTR STRL THRMB (HEMOSTASIS)
BASIN KIT SINGLE STR (MISCELLANEOUS) ×1 IMPLANT
BUR NEURO DRILL SOFT 3.0X3.8M (BURR) ×1 IMPLANT
CNTNR URN SCR LID CUP LEK RST (MISCELLANEOUS) ×1 IMPLANT
CONT SPEC 4OZ STRL OR WHT (MISCELLANEOUS) ×1
CONTROL REMOTE FREELINK ALPHA (NEUROSURGERY SUPPLIES) IMPLANT
COVER PROBE ULTRASOUND 5X96 (MISCELLANEOUS) IMPLANT
DERMABOND ADVANCED .7 DNX12 (GAUZE/BANDAGES/DRESSINGS) ×1 IMPLANT
DRAPE C ARM PK CFD 31 SPINE (DRAPES) ×1 IMPLANT
DRAPE LAPAROTOMY 100X77 ABD (DRAPES) ×1 IMPLANT
DRAPE SURG 17X11 SM STRL (DRAPES) ×1 IMPLANT
DRSG OPSITE POSTOP 4X6 (GAUZE/BANDAGES/DRESSINGS) IMPLANT
ELECT CAUTERY BLADE TIP 2.5 (TIP) ×1
ELECT EZSTD 165MM 6.5IN (MISCELLANEOUS) ×1
ELECT REM PT RETURN 9FT ADLT (ELECTROSURGICAL) ×1
ELECTRODE CAUTERY BLDE TIP 2.5 (TIP) ×1 IMPLANT
ELECTRODE EZSTD 165MM 6.5IN (MISCELLANEOUS) ×1 IMPLANT
ELECTRODE REM PT RTRN 9FT ADLT (ELECTROSURGICAL) ×1 IMPLANT
GLOVE BIOGEL PI IND STRL 6.5 (GLOVE) ×1 IMPLANT
GLOVE SURG SYN 6.5 ES PF (GLOVE) ×1 IMPLANT
GLOVE SURG SYN 6.5 PF PI (GLOVE) ×1 IMPLANT
GLOVE SURG SYN 8.5  E (GLOVE) ×3
GLOVE SURG SYN 8.5 E (GLOVE) ×3 IMPLANT
GLOVE SURG SYN 8.5 PF PI (GLOVE) ×3 IMPLANT
GOWN SRG LRG LVL 4 IMPRV REINF (GOWNS) ×1 IMPLANT
GOWN SRG XL LVL 3 NONREINFORCE (GOWNS) ×1 IMPLANT
GOWN STRL NON-REIN TWL XL LVL3 (GOWNS) ×1
GOWN STRL REIN LRG LVL4 (GOWNS) ×1
KIT CHARGING PRECISION NEURO (KITS) IMPLANT
KIT IPG ALPHA WAVEWRITER (Stimulator) IMPLANT
KIT SPINAL PRONEVIEW (KITS) ×1 IMPLANT
MANIFOLD NEPTUNE II (INSTRUMENTS) ×1 IMPLANT
MARKER SKIN DUAL TIP RULER LAB (MISCELLANEOUS) ×1 IMPLANT
NDL SAFETY ECLIP 18X1.5 (MISCELLANEOUS) ×2 IMPLANT
NS IRRIG 1000ML POUR BTL (IV SOLUTION) ×1 IMPLANT
PACK LAMINECTOMY NEURO (CUSTOM PROCEDURE TRAY) ×1 IMPLANT
PAD ARMBOARD 7.5X6 YLW CONV (MISCELLANEOUS) ×1 IMPLANT
SOLUTION IRRIG SURGIPHOR (IV SOLUTION) ×1 IMPLANT
SURGIFLO W/THROMBIN 8M KIT (HEMOSTASIS) IMPLANT
SUT DVC VLOC 3-0 CL 6 P-12 (SUTURE) ×1 IMPLANT
SUT VIC AB 0 CT1 27 (SUTURE) ×1
SUT VIC AB 0 CT1 27XCR 8 STRN (SUTURE) ×1 IMPLANT
SUT VIC AB 2-0 CT1 18 (SUTURE) ×1 IMPLANT
SYR 30ML LL (SYRINGE) ×1 IMPLANT
TOWEL OR 17X26 4PK STRL BLUE (TOWEL DISPOSABLE) ×2 IMPLANT
TRAP FLUID SMOKE EVACUATOR (MISCELLANEOUS) ×1 IMPLANT

## 2022-09-06 NOTE — Interval H&P Note (Signed)
History and Physical Interval Note:  09/06/2022 6:59 AM  Rachael Barnes  has presented today for surgery, with the diagnosis of Z45.42 Battery end of life of spinal cord stimulator.  The various methods of treatment have been discussed with the patient and family. After consideration of risks, benefits and other options for treatment, the patient has consented to  Procedure(s) with comments: REPLACE INTERNAL PULSE GENERATOR ([employer]) (N/A) - local with MAC as a surgical intervention.  The patient's history has been reviewed, patient examined, no change in status, stable for surgery.  I have reviewed the patient's chart and labs.  Questions were answered to the patient's satisfaction.    Heart sounds normal no MRG. Chest Clear to Auscultation Bilaterally.   Rachael Barnes

## 2022-09-06 NOTE — Discharge Instructions (Addendum)
Many times, patients feel better immediately after surgery and can "overdo it." Even if you feel well, it is important that you follow these activity guidelines. The following are instructions to help in your recovery once you have been discharged from the hospital.  * It is ok to take NSAIDs after surgery.  Activity    No bending, lifting, or twisting ("BLT"). Avoid lifting objects heavier than 10 pounds (gallon milk jug).  Where possible, avoid household activities that involve lifting, bending, pushing, or pulling such as laundry, vacuuming, grocery shopping, and childcare. Try to arrange for help from friends and family for these activities while your back heals.  Increase physical activity slowly as tolerated.  Taking short walks is encouraged, but avoid strenuous exercise. Do not jog, run, bicycle, lift weights, or participate in any other exercises unless specifically allowed by your doctor. Avoid prolonged sitting, including car rides.  Talk to your doctor before resuming sexual activity.  You should not drive until cleared by your doctor.  Until released by your doctor, you should not return to work or school.  You should rest at home and let your body heal.   You may shower three days after your surgery.  After showering, lightly dab your incision dry. Do not take a tub bath or go swimming for 3 weeks, or until approved by your doctor at your follow-up appointment.  If you smoke, we strongly recommend that you quit.  Smoking has been proven to interfere with normal healing in your back and will dramatically reduce the success rate of your surgery. Please contact [facility] (800-QUIT-NOW) and use the resources at www.[facility].com for assistance in stopping smoking.  Surgical Incision   If you have a dressing on your incision, you may remove it three days after your surgery. Keep your incision area clean and dry.  If you have staples or stitches on your incision, you should have a  follow up scheduled for removal. If you do not have staples or stitches, you will have steri-strips (small pieces of surgical tape) or Dermabond glue. The steri-strips/glue should begin to peel away within about a week (it is fine if the steri-strips fall off before then). If the strips are still in place one week after your surgery, you may gently remove them.  Diet            You may return to your usual diet. Be sure to stay hydrated.  When to Contact us  Although your surgery and recovery will likely be uneventful, you may have some residual numbness, aches, and pains in your back and/or legs. This is normal and should improve in the next few weeks.  However, should you experience any of the following, contact us immediately: New numbness or weakness Pain that is progressively getting worse, and is not relieved by your pain medications or rest Bleeding, redness, swelling, pain, or drainage from surgical incision Chills or flu-like symptoms Fever greater than 101.0 F (38.3 C) Problems with bowel or bladder functions Difficulty breathing or shortness of breath Warmth, tenderness, or swelling in your calf  Contact Information During office hours (Monday-Friday 9 am to 5 pm), please call your physician at [phone] and ask for Sharlot Gowda After hours and weekends, please call [phone] and speak with the neurosurgeon on call For a life-threatening emergency, call 911

## 2022-09-06 NOTE — Op Note (Signed)
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 Due to need for ongoing stimulation, replacement of IPG was recommended.   During preoperative discussion, we had discussed that lead revision was not planned for today.  Findings: successful placement of a [employer] IPG. 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     Preoperative Note:    Risks of surgery discussed in clinic.   Operative Note:      The patient was then brought from the preoperative center with intravenous access established.  The patient was rotated on the [place] table under her own control where all pressure points were appropriately padded.    The area was prepped and draped. Timeout was performed. A wide field block was performed over the left buttock where the prior IPG wa slocated.     The incision on the buttock was then opened . The prior IPG was identified and removed from the pocket.  The leads were removed and positioned into the new IPG.  The impedences from one lead were not appropriate, so the leads were inserted into the old battery and re-checked but the battery did not have enough charge to test.  A 2nd IPG was opened and the leads re-placed into that IPG.  The impedences of one of the leads was as expected, but the other remained out of compliance. The decision was made to close, as there was no way to remedy the lead issue.  The IPG was then inserted into the pouch. The wound was irrigated, then closed in layers with 0 and 2-0 vicryl.  The skin was approximated with monocryl. A sterile dressing was applied.    Patient was then rotated back to the preoperative bed awakened from  anesthesia and taken to recovery. All counts are correct in this case.   I performed the entire procedure.      Venetia Night MD

## 2022-09-06 NOTE — Transfer of Care (Signed)
Immediate Anesthesia Transfer of Care Note  Patient: Rachael Barnes  Procedure(s) Performed: REPLACE INTERNAL PULSE GENERATOR ([employer]) (Back)  Patient Location: PACU  Anesthesia Type:MAC  Level of Consciousness: awake, alert , and oriented  Airway & Oxygen Therapy: Patient Spontanous Breathing  Post-op Assessment: Report given to RN and Post -op Vital signs reviewed and stable  Post vital signs: stable  Last Vitals:  Vitals Value Taken Time  BP 132/87 09/06/22 0821  Temp    Pulse 54 09/06/22 0826  Resp 10 09/06/22 0826  SpO2 100 % 09/06/22 0826  Vitals shown include unvalidated device data.  Last Pain:  Vitals:   09/06/22 0620  TempSrc: Tympanic  PainSc: 8          Complications: No notable events documented.

## 2022-09-06 NOTE — Anesthesia Preprocedure Evaluation (Addendum)
Anesthesia Evaluation  Patient identified by MRN, date of birth, ID band Patient awake    Reviewed: Allergy & Precautions, NPO status , Patient's Chart, lab work & pertinent test results  Airway Mallampati: III  TM Distance: <3 FB Neck ROM: full    Dental  (+) Chipped   Pulmonary shortness of breath and with exertion, asthma , sleep apnea , former smoker   Pulmonary exam normal        Cardiovascular Exercise Tolerance: Good hypertension, (-) angina (-) Past MI and (-) DOE Normal cardiovascular exam     Neuro/Psych negative neurological ROS  negative psych ROS   GI/Hepatic Neg liver ROS,GERD  Controlled,,  Endo/Other  diabetes, Type 2Hypothyroidism    Renal/GU Renal disease  negative genitourinary   Musculoskeletal   Abdominal   Peds  Hematology negative hematology ROS (+)   Anesthesia Other Findings Past Medical History: No date: Acute pyelonephritis, antepartum, unspecified trimester No date: Acute renal failure superimposed on stage 4 chronic kidney  disease (HCC) No date: Anemia No date: Bronchitis No date: CKD (chronic kidney disease) No date: Diabetes mellitus (HCC) No date: Dyspnea No date: GERD (gastroesophageal reflux disease) No date: Glaucoma     Comment:  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 No date: History of migraine No date: HTN (hypertension) No date: Hypothyroidism No date: Macular degeneration No date: Obesity No date: OSA on CPAP No date: Pulmonary sarcoidosis (HCC) No date: Spinal stenosis No date: Type 2 diabetes mellitus without complication, without long- term current use of insulin (HCC)  Past Surgical History: No date: APPENDECTOMY No date: CARPAL TUNNEL RELEASE; Left No date: CHOLECYSTECTOMY No date: COLONOSCOPY 08/24/2020: COLONOSCOPY WITH PROPOFOL; N/A     Comment:  Procedure: COLONOSCOPY WITH PROPOFOL;  Surgeon:               Regis Bill, MD;  Location: [facility]  ENDOSCOPY;                Service: Endoscopy;  Laterality: N/A;  DM No date: EYE SURGERY; Bilateral     Comment:  cataracts No date: GLAUCOMA VALVE INSERTION; Right No date: NEUROMA SURGERY; Left     Comment:  foot No date: SHOULDER SURGERY; Left     Comment:  x2 No date: SPINAL CORD STIMULATOR INSERTION  BMI    Body Mass Index: 39.27 kg/m      Reproductive/Obstetrics negative OB ROS                             Anesthesia Physical Anesthesia Plan  ASA: 3  Anesthesia Plan: General   Post-op Pain Management:    Induction: Intravenous  PONV Risk Score and Plan:   Airway Management Planned: Natural Airway and Nasal Cannula  Additional Equipment:   Intra-op Plan:   Post-operative Plan:   Informed Consent: I have reviewed the patients History and Physical, chart, labs and discussed the procedure including the risks, benefits and alternatives for the proposed anesthesia with the patient or authorized representative who has indicated his/her understanding and acceptance.     Dental Advisory Given  Plan Discussed with: Anesthesiologist, CRNA and Surgeon  Anesthesia Plan Comments: (Thorough discussion with patient about the depth of anesthesia that she will receive today.  That our goal will not be full GA with a natural airway. That she may remember the procedure. Patient voiced assent.  Patient consented for risks of anesthesia including but not limited to:  -  adverse reactions to medications - risk of airway placement if required - damage to eyes, teeth, lips or other oral mucosa - nerve damage due to positioning  - sore throat or hoarseness - Damage to heart, brain, nerves, lungs, other parts of body or loss of life  Patient voiced understanding.)       Anesthesia Quick Evaluation

## 2022-09-06 NOTE — Anesthesia Postprocedure Evaluation (Signed)
Anesthesia Post Note  Patient: Rachael Barnes  Procedure(s) Performed: REPLACE INTERNAL PULSE GENERATOR ([employer]) (Back)  Patient location during evaluation: PACU Anesthesia Type: General Level of consciousness: awake and alert Pain management: pain level controlled Vital Signs Assessment: post-procedure vital signs reviewed and stable Respiratory status: spontaneous breathing, nonlabored ventilation, respiratory function stable and patient connected to nasal cannula oxygen Cardiovascular status: blood pressure returned to baseline and stable Postop Assessment: no apparent nausea or vomiting Anesthetic complications: no   No notable events documented.   Last Vitals:  Vitals:   09/06/22 0900 09/06/22 0927  BP: (!) 143/52 (!) 158/75  Pulse: 60 68  Resp: 17 18  Temp:  (!) 36.2 C  SpO2: 100% 99%    Last Pain:  Vitals:   09/06/22 0927  TempSrc: Temporal  PainSc: 0-No pain                 Cleda Mccreedy [employer]

## 2022-09-07 ENCOUNTER — Encounter: Payer: Self-pay | Admitting: Neurosurgery

## 2022-09-15 NOTE — Progress Notes (Unsigned)
   REFERRING PHYSICIAN:  Gracelyn Nurse, Md [address] [place],  [place] [address]  DOS: 09/06/22  replacement of [employer] IPG  HISTORY OF PRESENT ILLNESS: Rachael Barnes is approximately 2 weeks status post replacement of [employer] IPG. Was given oxycodone on discharge from the hospital.   She is doing well. She has some mild intermittent incisional pain. SCS is functional and helping with her chronic pain. She is not taking oxycodone.    PHYSICAL EXAMINATION:  General: Patient is well developed, well nourished, calm, collected, and in no apparent distress.   NEUROLOGICAL:  General: In no acute distress.   Awake, alert, oriented to person, place, and time.  Pupils equal round and reactive to light.  Facial tone is symmetric.     Strength:            Side Iliopsoas Quads Hamstring PF DF EHL  R 5 5 5 5 5 5   L 5 5 5 5 5 5    Incision c/d/i   ROS (Neurologic):  Negative except as noted above  IMAGING: Nothing new to review.   ASSESSMENT/PLAN:  Rachael Barnes is doing well s/p above surgery. Treatment options reviewed with patient and following plan made:   - Reviewed wound care. Do not submerge incision for until she is 6 weeks postop.  - Care with bending, twisting, or lifting.  - [employer] Rep was in to see patient today for some programming of SCS. - Follow up prn. She will call with any wound issues.   Advised to contact the office if any questions or concerns arise.  Drake Leach PA-C Department of neurosurgery

## 2022-09-19 ENCOUNTER — Encounter: Payer: Self-pay | Admitting: Orthopedic Surgery

## 2022-09-19 ENCOUNTER — Ambulatory Visit (INDEPENDENT_AMBULATORY_CARE_PROVIDER_SITE_OTHER): Payer: Medicare HMO | Admitting: Orthopedic Surgery

## 2022-09-19 VITALS — BP 128/76 | Temp 97.8°F | Ht 64.0 in | Wt 228.0 lb

## 2022-09-19 DIAGNOSIS — Z4542 Encounter for adjustment and management of neuropacemaker (brain) (peripheral nerve) (spinal cord): Secondary | ICD-10-CM | POA: Diagnosis not present

## 2022-12-01 NOTE — Addendum Note (Signed)
Addended byDrake Leach on: 12/01/2022 12:46 PM   Modules accepted: Level of Service

## 2022-12-03 IMAGING — CT CT HEAD W/O CM
4 series · 16 of 47 positions shown, 18 images · non-contrast
Comparison: None.

CLINICAL DATA: Mental status changes of unknown etiology.

EXAM:
CT HEAD WITHOUT CONTRAST
TECHNIQUE: Contiguous axial images were obtained from the base of the skull
through the vertex without intravenous contrast.

[Series 2: head bone · axial · 0.43mm/px · z∈[-99,-67]mm · 3 of 78 slices shown]
[im 8/78  bone]
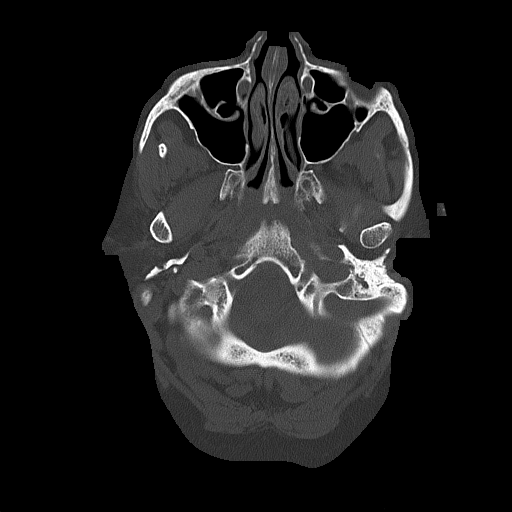
[im 16/78  bone]
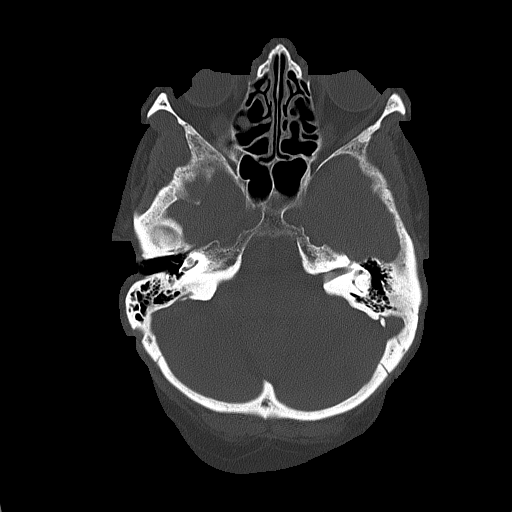
[im 24/78  bone]
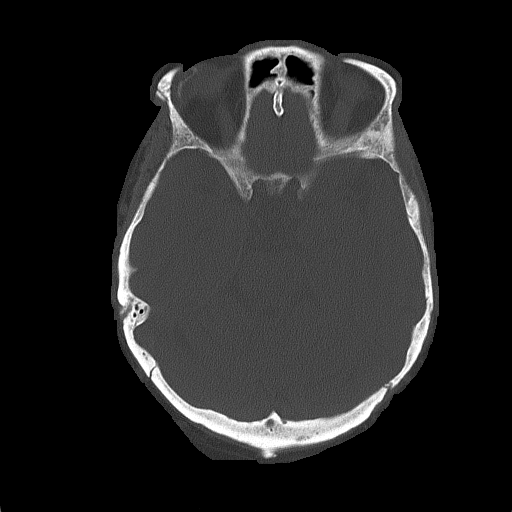

[Series 3: head wo · axial · 0.43mm/px · z∈[-98,+22]mm · 7 of 32 slices shown, 9 images]
[im 4/32  brain]
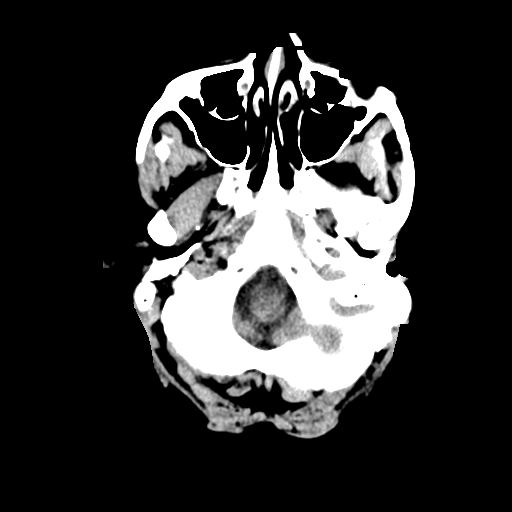
[im 4/32  bone]
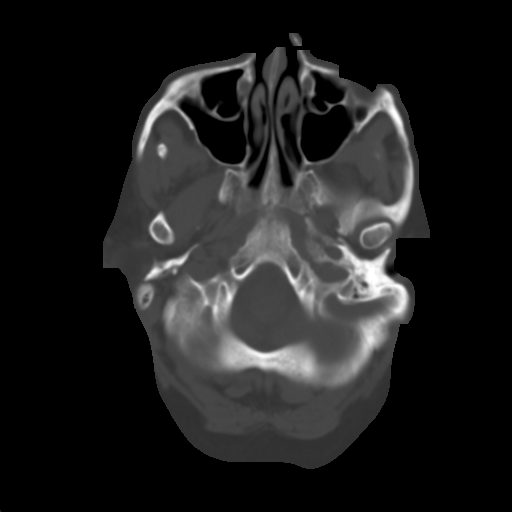
[im 8/32  brain]
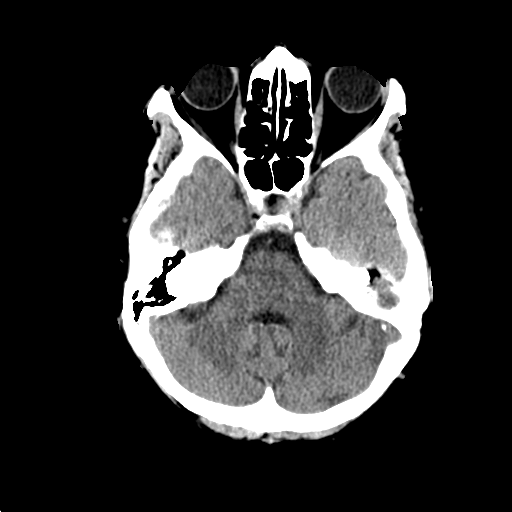
[im 12/32  brain]
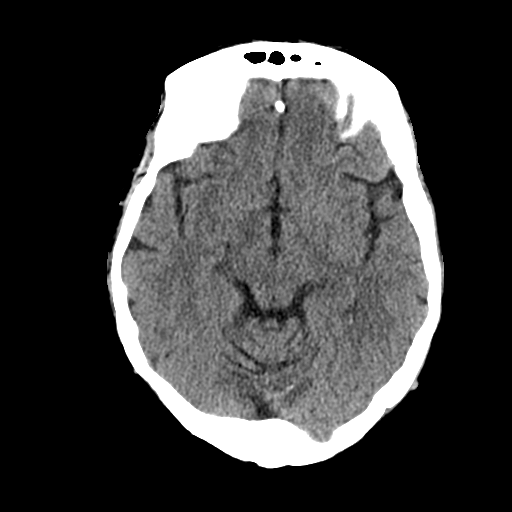
[im 16/32  brain]
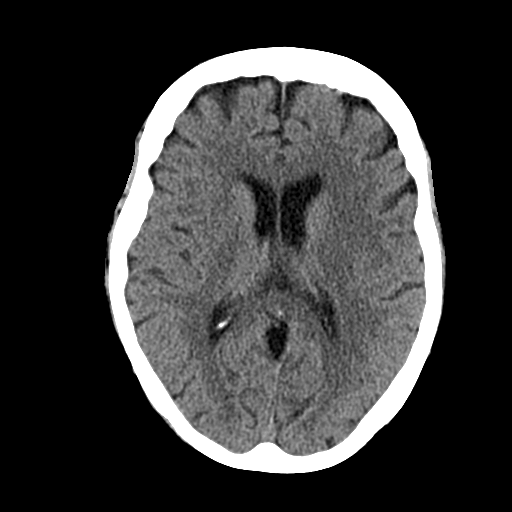
[im 20/32  brain]
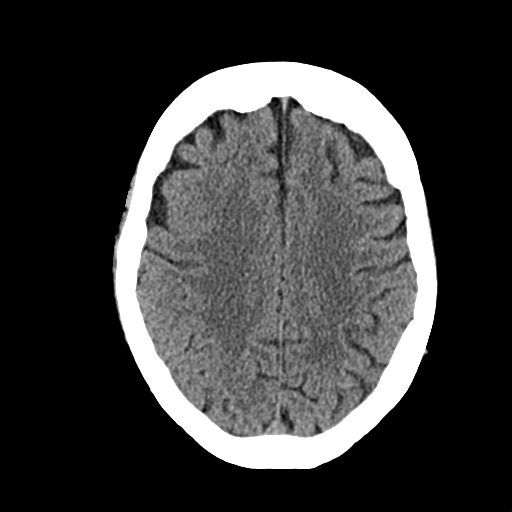
[im 20/32  bone]
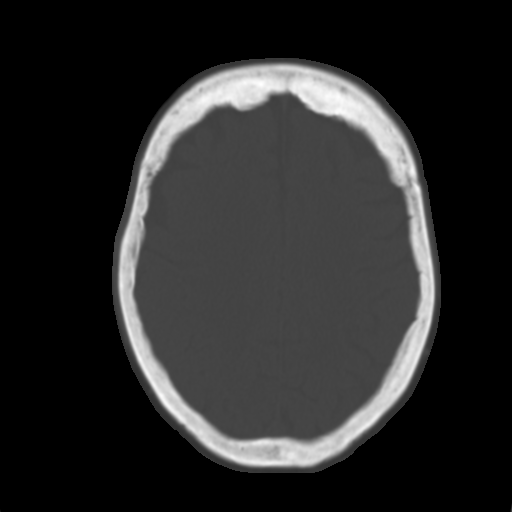
[im 24/32  brain]
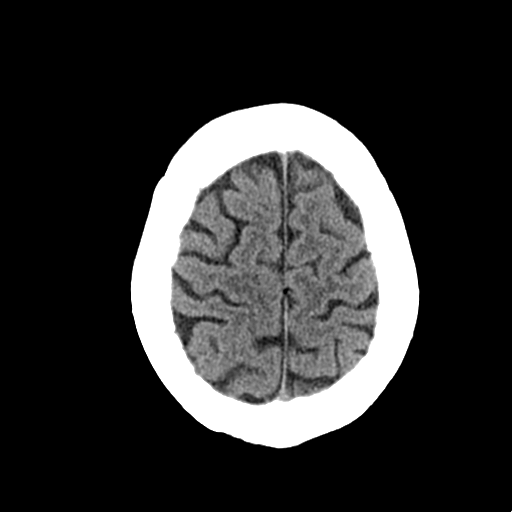
[im 28/32  brain]
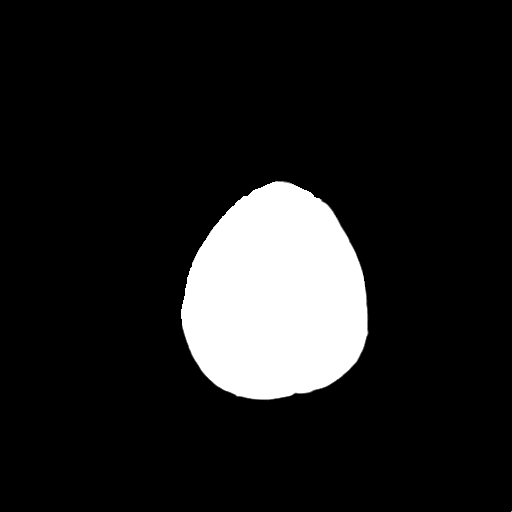

[Series 4: coronal soft tissue · coronal · 0.30mm/px · 3 of 69 slices shown]
[im 23/69  brain]
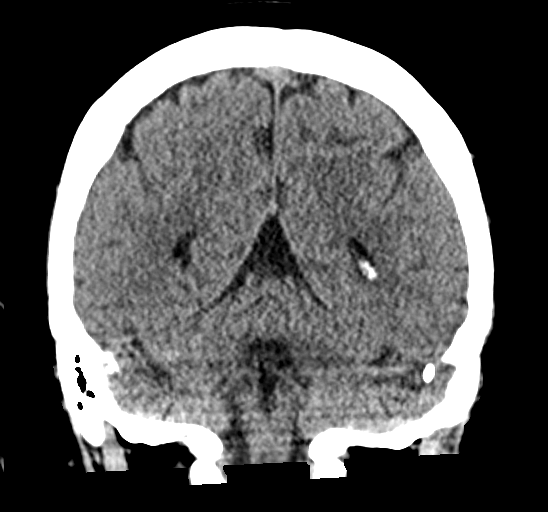
[im 31/69  brain]
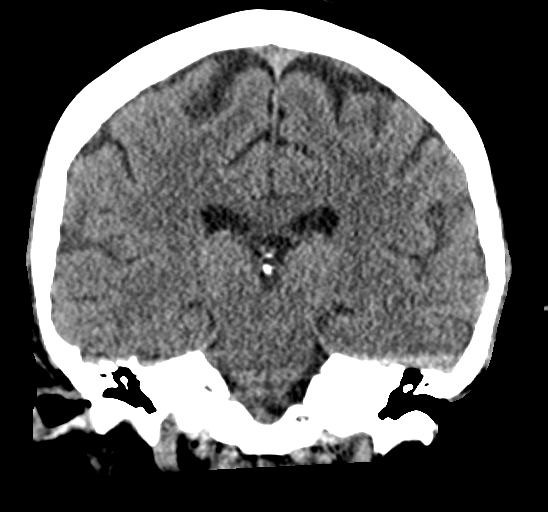
[im 38/69  brain]
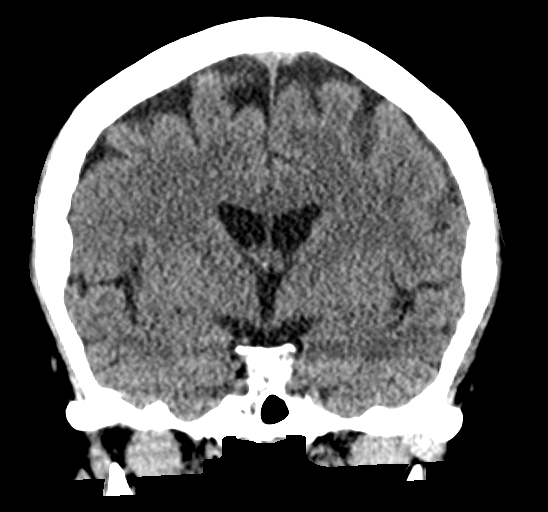

[Series 5: sagittal soft tissue · sagittal · 0.30mm/px · 3 of 58 slices shown]
[im 20/58  brain]
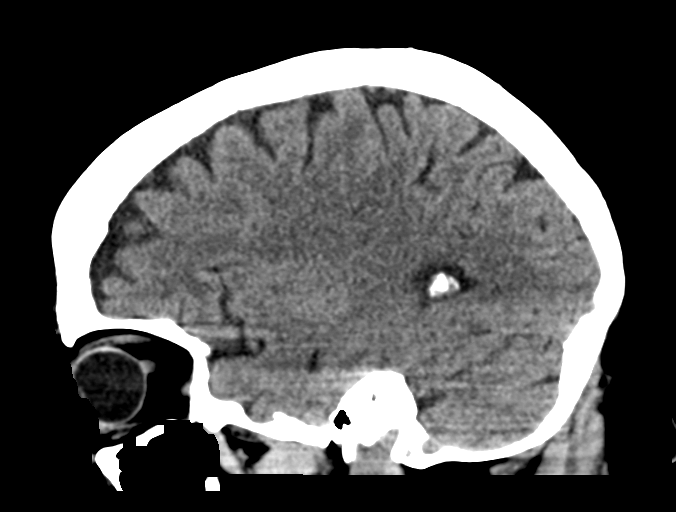
[im 29/58  brain]
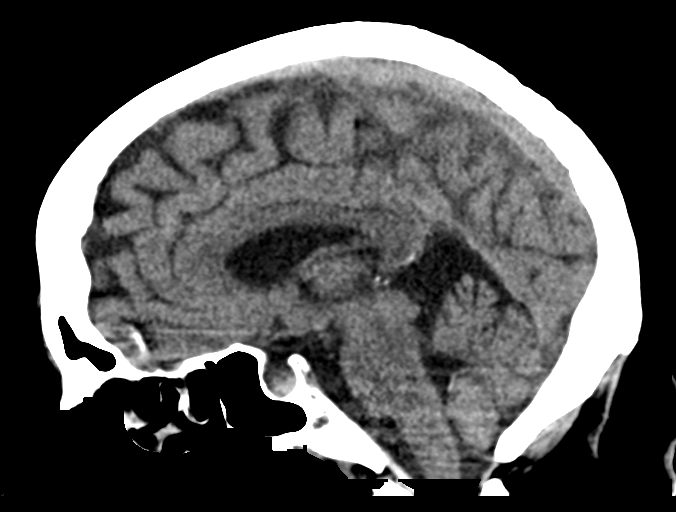
[im 39/58  brain]
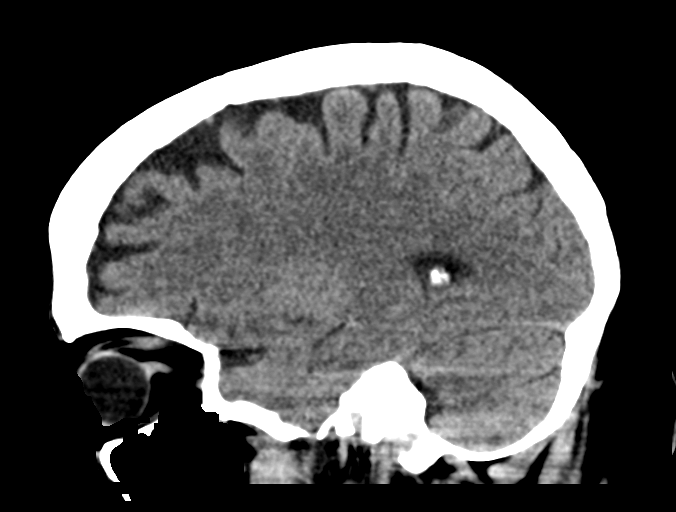

[16 of 47 positions shown; findings below may reference images not displayed]

FINDINGS: Brain: No evidence of acute infarction, hemorrhage, hydrocephalus,
extra-axial collection or mass lesion/mass effect. Mild cerebral and
cerebellar cortical atrophy.

Vascular: No hyperdense vessel or unexpected calcification.

Skull: Normal. Negative for fracture or focal lesion.

Sinuses/Orbits: Evidence of prior lens replacements. There is mild
membrane thickening in the ethmoid air cells, 1 cm retention cyst or
polyp posteriorly in the left maxillary sinus.

Other sinuses and bilateral mastoid air cells are clear. Nasal
septum deviates to the right.

Other: None.
IMPRESSION: No acute intracranial CT findings.

## 2022-12-03 IMAGING — CR DG KNEE COMPLETE 4+V*R*
1 series · 4 of 4 positions shown · non-contrast
Comparison: None.

CLINICAL DATA: Right knee pain, fell

EXAM:
RIGHT KNEE - COMPLETE 4+ VIEW

[Series 1: dg knee complete 4 views right · 0.14mm/px · 4 of 4 slices shown]
[im 1/4]
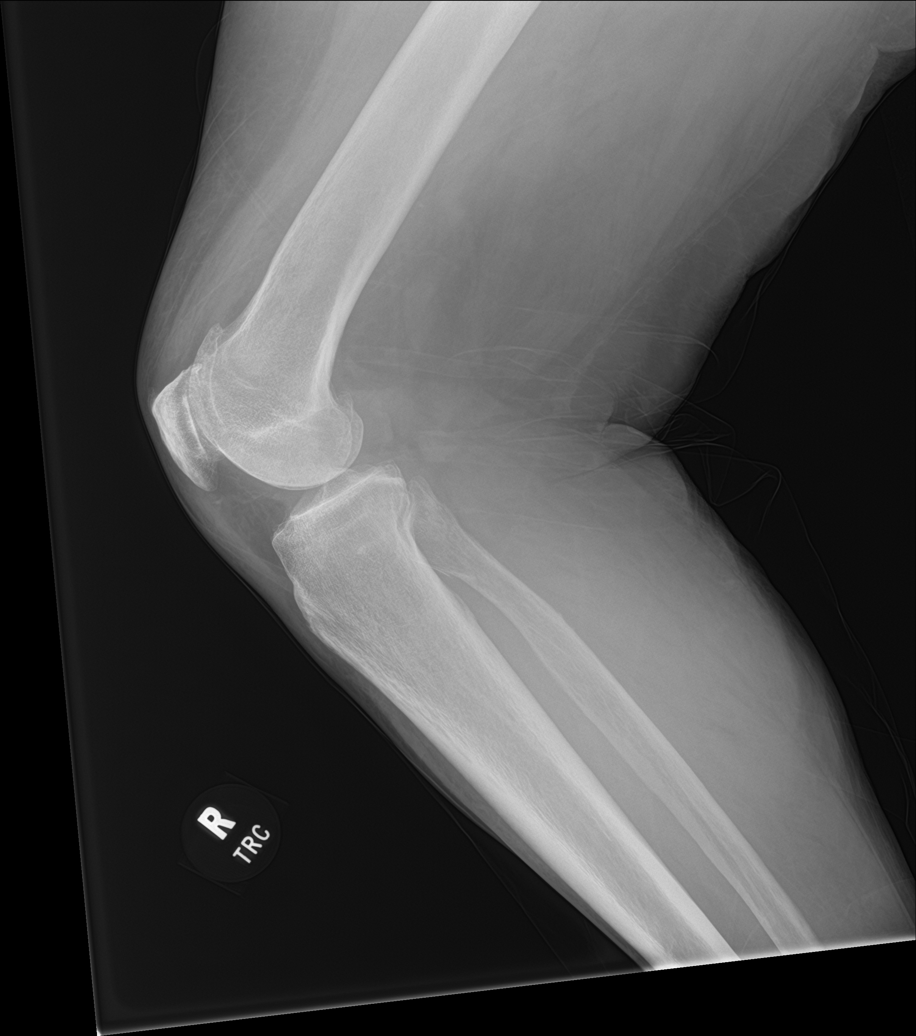
[im 2/4]
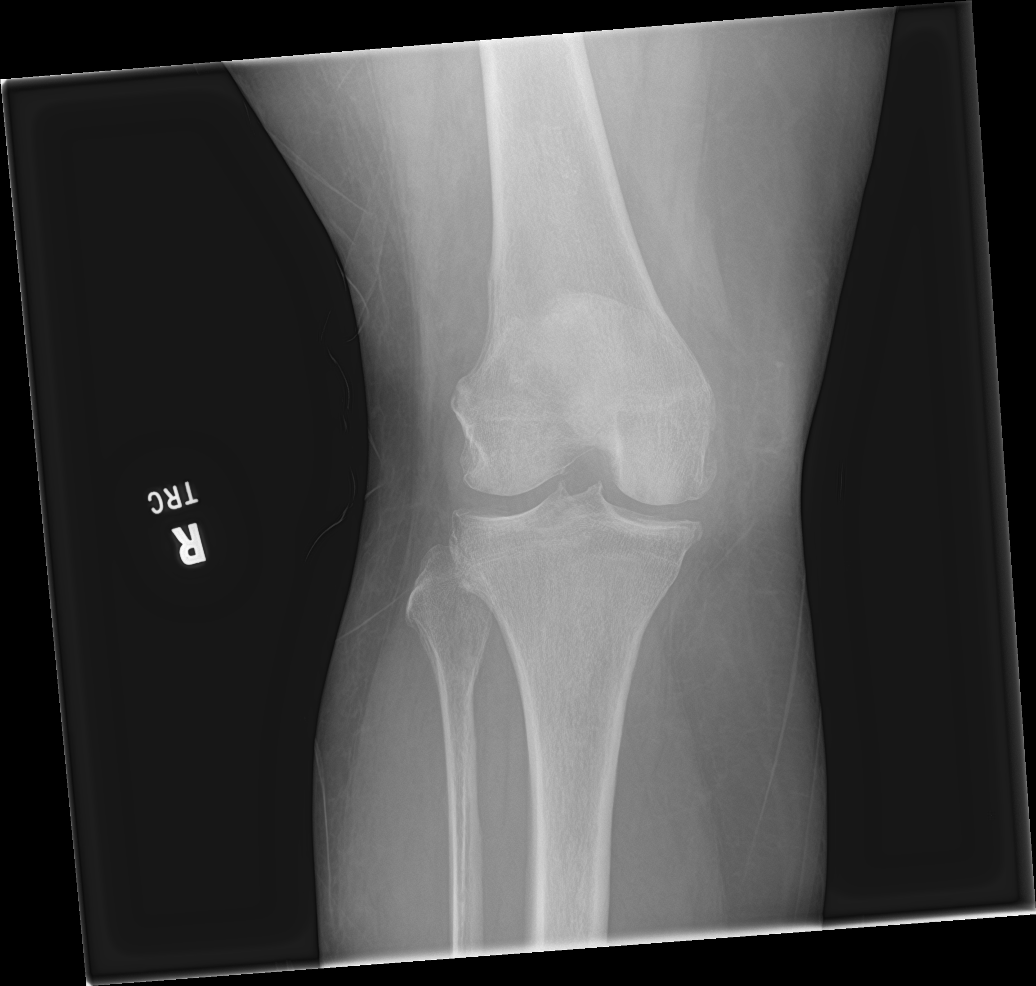
[im 3/4]
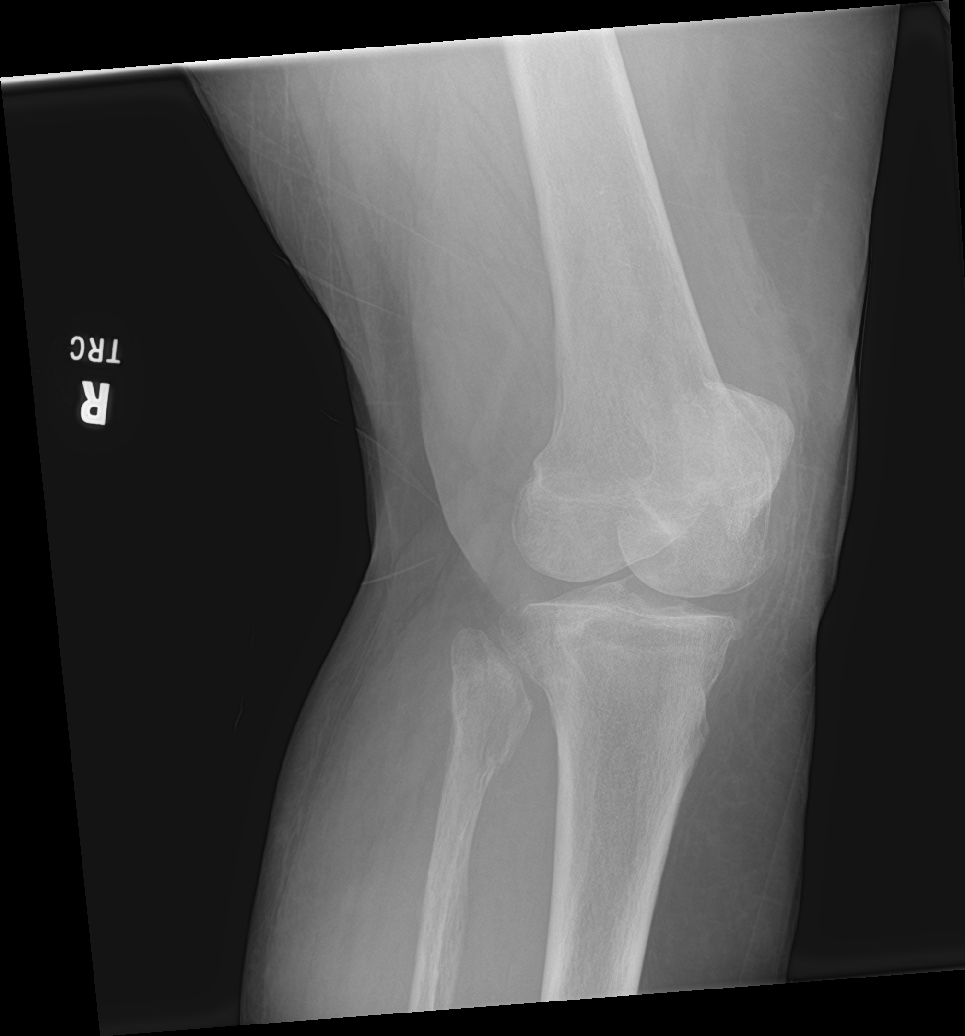
[im 4/4]
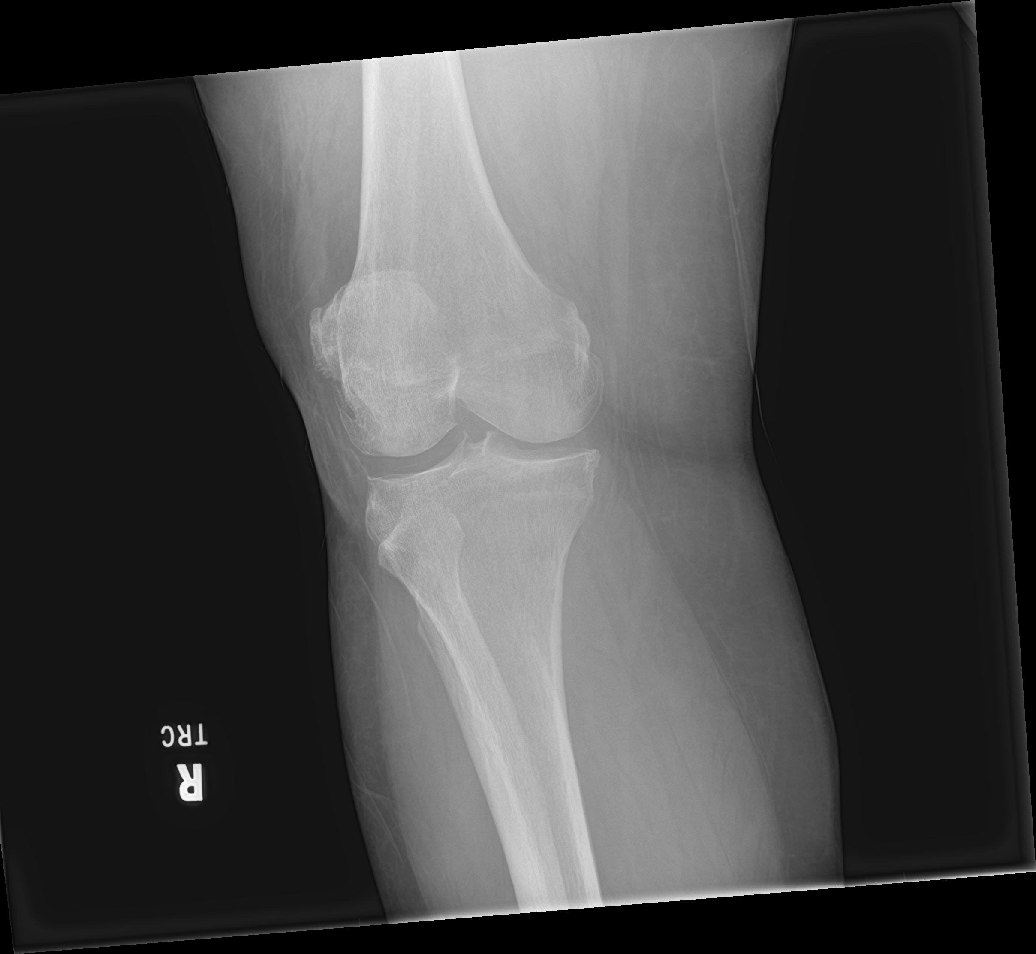

[4 of 4 positions shown; findings below may reference images not displayed]

FINDINGS: Frontal, bilateral oblique, lateral views of the right knee are
obtained. There is a nondisplaced fracture of the proximal fibula,
best seen on the internal oblique projection. No other acute bony
abnormalities. There is mild medial and lateral compartmental joint
space narrowing, with moderate to severe patellofemoral
compartmental osteoarthritis. No significant joint effusion. Soft
tissues are unremarkable.
IMPRESSION: 1. Nondisplaced proximal fibular head fracture.
2. Three compartmental osteoarthritis greatest in the patellofemoral
compartment.

## 2022-12-11 ENCOUNTER — Other Ambulatory Visit: Payer: Self-pay | Admitting: Student

## 2022-12-11 DIAGNOSIS — G8929 Other chronic pain: Secondary | ICD-10-CM

## 2022-12-11 DIAGNOSIS — M7521 Bicipital tendinitis, right shoulder: Secondary | ICD-10-CM

## 2022-12-11 DIAGNOSIS — M75101 Unspecified rotator cuff tear or rupture of right shoulder, not specified as traumatic: Secondary | ICD-10-CM

## 2022-12-19 ENCOUNTER — Other Ambulatory Visit: Payer: Self-pay | Admitting: Student

## 2022-12-19 DIAGNOSIS — M7581 Other shoulder lesions, right shoulder: Secondary | ICD-10-CM

## 2022-12-19 DIAGNOSIS — M7521 Bicipital tendinitis, right shoulder: Secondary | ICD-10-CM

## 2022-12-19 DIAGNOSIS — G8929 Other chronic pain: Secondary | ICD-10-CM

## 2022-12-19 DIAGNOSIS — M75101 Unspecified rotator cuff tear or rupture of right shoulder, not specified as traumatic: Secondary | ICD-10-CM

## 2022-12-20 ENCOUNTER — Encounter: Payer: Self-pay | Admitting: Student

## 2022-12-25 ENCOUNTER — Inpatient Hospital Stay: Admission: RE | Admit: 2022-12-25 | Payer: Medicare HMO | Source: Ambulatory Visit

## 2023-01-09 ENCOUNTER — Emergency Department
Admission: EM | Admit: 2023-01-09 | Discharge: 2023-01-09 | Disposition: A | Discharge status: Home / Self Care | Payer: Medicare HMO | Attending: Student in an Organized Health Care Education/Training Program | Admitting: Student in an Organized Health Care Education/Training Program

## 2023-01-09 ENCOUNTER — Emergency Department: Payer: Medicare HMO

## 2023-01-09 DIAGNOSIS — R112 Nausea with vomiting, unspecified: Secondary | ICD-10-CM | POA: Diagnosis not present

## 2023-01-09 DIAGNOSIS — R42 Dizziness and giddiness: Secondary | ICD-10-CM | POA: Diagnosis present

## 2023-01-09 LAB — URINALYSIS, ROUTINE W REFLEX MICROSCOPIC
Bilirubin Urine: NEGATIVE
Glucose, UA: NEGATIVE mg/dL
Hgb urine dipstick: NEGATIVE
Ketones, ur: NEGATIVE mg/dL
Nitrite: NEGATIVE
Protein, ur: NEGATIVE mg/dL
Specific Gravity, Urine: 1.009 (ref 1.005–1.030)
pH: 5 (ref 5.0–8.0)

## 2023-01-09 LAB — BASIC METABOLIC PANEL
Anion gap: 12 (ref 5–15)
BUN: 23 mg/dL (ref 8–23)
CO2: 18 mmol/L — ABNORMAL LOW (ref 22–32)
Calcium: 9.2 mg/dL (ref 8.9–10.3)
Chloride: 108 mmol/L (ref 98–111)
Creatinine, Ser: 1.63 mg/dL — ABNORMAL HIGH (ref 0.44–1.00)
GFR, Estimated: 34 mL/min — ABNORMAL LOW (ref >60)
Glucose, Bld: 178 mg/dL — ABNORMAL HIGH (ref 70–99)
Potassium: 3.6 mmol/L (ref 3.5–5.1)
Sodium: 138 mmol/L (ref 135–145)

## 2023-01-09 LAB — CBC
HCT: 36.1 % (ref 36.0–46.0)
Hemoglobin: 11.3 g/dL — ABNORMAL LOW (ref 12.0–15.0)
MCH: 24.7 pg — ABNORMAL LOW (ref 26.0–34.0)
MCHC: 31.3 g/dL (ref 30.0–36.0)
MCV: 79 fL — ABNORMAL LOW (ref 80.0–100.0)
Platelets: 277 10*3/uL (ref 150–400)
RBC: 4.57 MIL/uL (ref 3.87–5.11)
RDW: 14.6 % (ref 11.5–15.5)
WBC: 6.9 10*3/uL (ref 4.0–10.5)
nRBC: 0 % (ref 0.0–0.2)

## 2023-01-09 LAB — CBG MONITORING, ED: Glucose-Capillary: 173 mg/dL — ABNORMAL HIGH (ref 70–99)

## 2023-01-09 MED ORDER — MECLIZINE HCL 25 MG PO TABS
25.0000 mg | ORAL_TABLET | Freq: Once | ORAL | Status: AC
  Administered 2023-01-09: 25 mg via ORAL
  Filled 2023-01-09: qty 1

## 2023-01-09 MED ORDER — IOHEXOL 350 MG/ML SOLN
60.0000 mL | Freq: Once | INTRAVENOUS | Status: AC | PRN
  Administered 2023-01-09: 60 mL via INTRAVENOUS

## 2023-01-09 MED ORDER — MECLIZINE HCL 25 MG PO CHEW: 25.0000 mg | CHEWABLE_TABLET | Freq: Three times a day (TID) | ORAL | 0 refills | Status: DC | PRN

## 2023-01-09 NOTE — ED Notes (Signed)
Pt ambulated in the hallway with minimal assistance.

## 2023-01-09 NOTE — ED Triage Notes (Signed)
First Nurse Note;  Pt via POV from [facility]. Pt c/o lightheadedness, dizziness, NVD since Friday. VSS.

## 2023-01-09 NOTE — ED Provider Notes (Signed)
[facility] Provider Note    Event Date/Time   First MD Initiated Contact with Patient 01/09/23 1458     (approximate)   History   Dizziness   HPI  Rachael Barnes is a 72 y.o. female who presents to the ER for evaluation of dizziness since Friday.  States that anytime she closes her eyes gets she feels like the room is spinning.  Has had associated nausea and vomiting.  Denies any fevers.  No headaches.  No falls.  Denies any chest pain shortness of breath no previous history of CVA.     Physical Exam   Triage Vital Signs: ED Triage Vitals  Encounter Vitals Group     BP 01/09/23 1135 (!) 144/90     Systolic BP Percentile --      Diastolic BP Percentile --      Pulse Rate 01/09/23 1135 79     Resp 01/09/23 1135 16     Temp 01/09/23 1135 98.2 F (36.8 C)     Temp Source 01/09/23 1135 Oral     SpO2 01/09/23 1135 97 %     Weight 01/09/23 1133 228 lb (103.4 kg)     Height 01/09/23 1133 5\' 4"  (1.626 m)     Head Circumference --      Peak Flow --      Pain Score 01/09/23 1133 0     Pain Loc --      Pain Education --      Exclude from Growth Chart --     Most recent vital signs: Vitals:   01/09/23 1507 01/09/23 1518  BP: 139/74 139/74  Pulse:  69  Resp:  17  Temp:  98.1 F (36.7 C)  SpO2:  99%     Constitutional: Alert  Eyes: Conjunctivae are normal.  Head: Atraumatic. Nose: No congestion/rhinnorhea. Mouth/Throat: Mucous membranes are moist.   Neck: Painless ROM.  Cardiovascular:   Good peripheral circulation. Respiratory: Normal respiratory effort.  No retractions.  Gastrointestinal: Soft and nontender.  Musculoskeletal:  no deformity Neurologic:  MAE spontaneously. No gross focal neurologic deficits are appreciated.  Skin:  Skin is warm, dry and intact. No rash noted. Psychiatric: Mood and affect are normal. Speech and behavior are normal.    ED Results / Procedures / Treatments   Labs (all labs ordered are listed, but only  abnormal results are displayed) Labs Reviewed  BASIC METABOLIC PANEL - Abnormal; Notable for the following components:      Result Value   CO2 18 (*)    Glucose, Bld 178 (*)    Creatinine, Ser 1.63 (*)    GFR, Estimated 34 (*)    All other components within normal limits  CBC - Abnormal; Notable for the following components:   Hemoglobin 11.3 (*)    MCV 79.0 (*)    MCH 24.7 (*)    All other components within normal limits  URINALYSIS, ROUTINE W REFLEX MICROSCOPIC - Abnormal; Notable for the following components:   Color, Urine STRAW (*)    APPearance CLEAR (*)    Leukocytes,Ua TRACE (*)    Bacteria, UA FEW (*)    All other components within normal limits  CBG MONITORING, ED - Abnormal; Notable for the following components:   Glucose-Capillary 173 (*)    All other components within normal limits     EKG  ED ECG REPORT I, Willy Eddy, the attending physician, personally viewed and interpreted this ECG.   Date: 01/09/2023  EKG  Time: 11:45  Rate: 70  Rhythm: sinus  Axis: normal  Intervals: normal intervals  ST&T Change: no stemi, no depressions    RADIOLOGY Please see ED Course for my review and interpretation.  I personally reviewed all radiographic images ordered to evaluate for the above acute complaints and reviewed radiology reports and findings.  These findings were personally discussed with the patient.  Please see medical record for radiology report.    PROCEDURES:  Critical Care performed: No  Procedures   MEDICATIONS ORDERED IN ED: Medications  meclizine (ANTIVERT) tablet 25 mg (25 mg Oral Given 01/09/23 1516)  iohexol (OMNIPAQUE) 350 MG/ML injection 60 mL (60 mLs Intravenous Contrast Given 01/09/23 1531)     IMPRESSION / MDM / ASSESSMENT AND PLAN / ED COURSE  I reviewed the triage vital signs and the nursing notes.                              Differential diagnosis includes, but is not limited to, cva, tia, hypoglycemia, dehydration,  electrolyte abnormality, dissection, sepsis  Patient presenting to the ER for evaluation of symptoms as described above.  Based on symptoms, risk factors and considered above differential, this presenting complaint could reflect a potentially life-threatening illness therefore the patient will be placed on continuous pulse oximetry and telemetry for monitoring.  Laboratory evaluation will be sent to evaluate for the above complaints.      Clinical Course as of 01/09/23 1855  Tue Jan 09, 2023  1539 CT head on my review and interpretation without evidence of IPH or bleed. [PR]  1734 CT imaging without evidence of acute abnormality.  She feels improved after meclizine.  Will perform ambulation trial. [PR]    Clinical Course User Index [PR] Willy Eddy, MD   Patient able to ambulate with minimal assistance and is at her baseline.  Given reassuring workup suspect peripheral vertigo.  Doubt posterior circulation CVA given lack of objective neuro findings and benign hints exam.  Does appear appropriate for outpatient follow-up.  FINAL CLINICAL IMPRESSION(S) / ED DIAGNOSES   Final diagnoses:  Vertigo     Rx / DC Orders   ED Discharge Orders          Ordered    Meclizine HCl (ANTIVERT) 25 MG CHEW  Every 8 hours PRN        01/09/23 1812             Note:  This document was prepared using Dragon voice recognition software and may include unintentional dictation errors.    Willy Eddy, MD 01/09/23 [phone]

## 2023-01-09 NOTE — ED Triage Notes (Signed)
Pt c/o dizziness and nausea with position changes since Saturday. One episode of vomiting over the weekend, and frequent episodes of loose stools. Denies diarrhea.

## 2023-01-30 ENCOUNTER — Ambulatory Visit
Admission: RE | Admit: 2023-01-30 | Discharge: 2023-01-30 | Disposition: A | Discharge status: Home / Self Care | Payer: Medicare HMO | Source: Ambulatory Visit | Attending: Student

## 2023-01-30 DIAGNOSIS — M7521 Bicipital tendinitis, right shoulder: Secondary | ICD-10-CM

## 2023-01-30 DIAGNOSIS — M7581 Other shoulder lesions, right shoulder: Secondary | ICD-10-CM

## 2023-01-30 DIAGNOSIS — G8929 Other chronic pain: Secondary | ICD-10-CM

## 2023-01-30 DIAGNOSIS — M75101 Unspecified rotator cuff tear or rupture of right shoulder, not specified as traumatic: Secondary | ICD-10-CM

## 2023-01-30 MED ORDER — IOPAMIDOL (ISOVUE-M 200) INJECTION 41%
15.0000 mL | Freq: Once | INTRAMUSCULAR | Status: AC
  Administered 2023-01-30: 15 mL via INTRA_ARTICULAR

## 2023-02-28 ENCOUNTER — Other Ambulatory Visit: Payer: Self-pay | Admitting: Surgery

## 2023-03-01 ENCOUNTER — Encounter
Admission: RE | Admit: 2023-03-01 | Discharge: 2023-03-01 | Disposition: A | Discharge status: Home / Self Care | Payer: Medicare HMO | Source: Ambulatory Visit | Attending: Surgery | Admitting: Surgery

## 2023-03-01 VITALS — Ht 64.0 in | Wt 230.6 lb

## 2023-03-01 DIAGNOSIS — Z01812 Encounter for preprocedural laboratory examination: Secondary | ICD-10-CM

## 2023-03-01 DIAGNOSIS — I10 Essential (primary) hypertension: Secondary | ICD-10-CM

## 2023-03-01 DIAGNOSIS — D860 Sarcoidosis of lung: Secondary | ICD-10-CM

## 2023-03-01 DIAGNOSIS — Z79899 Other long term (current) drug therapy: Secondary | ICD-10-CM

## 2023-03-01 DIAGNOSIS — Z01818 Encounter for other preprocedural examination: Secondary | ICD-10-CM

## 2023-03-01 HISTORY — DX: Encounter for adjustment and management of neurostimulator: Z45.42

## 2023-03-01 HISTORY — DX: Strain of other muscles, fascia and tendons at shoulder and upper arm level, unspecified arm, initial encounter: S46.819A

## 2023-03-01 HISTORY — DX: Bicipital tendinitis, unspecified shoulder: M75.20

## 2023-03-01 HISTORY — DX: Other shoulder lesions, unspecified shoulder: M75.80

## 2023-03-01 HISTORY — DX: Type 2 diabetes mellitus without complications: E11.9

## 2023-03-01 HISTORY — DX: Benign paroxysmal vertigo, unspecified ear: H81.10

## 2023-03-01 NOTE — Patient Instructions (Addendum)
Your procedure is scheduled on:03-08-23 Thursday Report to the Registration Desk on the 1st floor of the [facility].Then proceed to the 2nd floor Surgery Desk To find out your arrival time, please call [phone] between 1PM - 3PM on:03-07-23 Wednesday If your arrival time is 6:00 am, do not arrive before that time as the [facility] entrance doors do not open until 6:00 am.  REMEMBER: Instructions that are not followed completely may result in serious medical risk, up to and including death; or upon the discretion of your surgeon and anesthesiologist your surgery may need to be rescheduled.  Do not eat food after midnight the night before surgery.  No gum chewing or hard candies.  You may however, drink Water up to 2 hours before you are scheduled to arrive for your surgery. Do not drink anything within 2 hours of your scheduled arrival time.  In addition, your doctor has ordered for you to drink the provided:  Gatorade G2 Drinking this carbohydrate drink up to two hours before surgery helps to reduce insulin resistance and improve patient outcomes. Please complete drinking 2 hours before scheduled arrival time.  One week prior to surgery:Stop NOW (03-01-23) Stop Anti-inflammatories (NSAIDS) such as Advil, Aleve, Ibuprofen, Motrin, Naproxen, Naprosyn and Aspirin based products such as Excedrin, Goody's Powder, BC Powder. Stop ANY OVER THE COUNTER supplements until after surgery (Vitamin D)  You may however, continue to take Tylenol if needed for pain up until the day of surgery.  Stop metFORMIN (GLUMETZA) 2 days prior to surgery-Last dose will be on 03-05-23 Monday  Stop Ozempic 7 days prior to surgery-Do NOT take again until AFTER surgery  Continue taking all of your other prescription medications up until the day of surgery.  ON THE DAY OF SURGERY ONLY TAKE THESE MEDICATIONS WITH SIPS OF WATER: -amLODipine (NORVASC)  -cetirizine (ZYRTEC)  -levothyroxine (SYNTHROID)   -potassium chloride (KLOR-CON)   You may continue your 81 mg Aspirin up until the day prior to surgery-Do NOT take the morning of surgery  Use your Albuterol Nebulizer the morning of surgery and bring your Albuterol Inhaler to the hospital  No Alcohol for 24 hours before or after surgery.  No Smoking including e-cigarettes for 24 hours before surgery.  No chewable tobacco products for at least 6 hours before surgery.  No nicotine patches on the day of surgery.  Do not use any "recreational" drugs for at least a week (preferably 2 weeks) before your surgery.  Please be advised that the combination of cocaine and anesthesia may have negative outcomes, up to and including death. If you test positive for cocaine, your surgery will be cancelled.  On the morning of surgery brush your teeth with toothpaste and water, you may rinse your mouth with mouthwash if you wish. Do not swallow any toothpaste or mouthwash.  Use CHG Soap as directed on instruction sheet.  Do not wear jewelry, make-up, hairpins, clips or nail polish.  For welded (permanent) jewelry: bracelets, anklets, waist bands, etc.  Please have this removed prior to surgery.  If it is not removed, there is a chance that hospital personnel will need to cut it off on the day of surgery.  Do not wear lotions, powders, or perfumes.   Do not shave body hair from the neck down 48 hours before surgery.  Contact lenses, hearing aids and dentures may not be worn into surgery.  Do not bring valuables to the hospital. [facility] is not responsible for any missing/lost belongings or  valuables.   Bring your C-PAP to the hospital  Notify your doctor if there is any change in your medical condition (cold, fever, infection).  Wear comfortable clothing (specific to your surgery type) to the hospital.  After surgery, you can help prevent lung complications by doing breathing exercises.  Take deep breaths and cough every 1-2 hours. Your  doctor may order a device called an Incentive Spirometer to help you take deep breaths. When coughing or sneezing, hold a pillow firmly against your incision with both hands. This is called "splinting." Doing this helps protect your incision. It also decreases belly discomfort.  If you are being admitted to the hospital overnight, leave your suitcase in the car. After surgery it may be brought to your room.  In case of increased patient census, it may be necessary for you, the patient, to continue your postoperative care in the Same Day Surgery department.  If you are being discharged the day of surgery, you will not be allowed to drive home. You will need a responsible individual to drive you home and stay with you for 24 hours after surgery.   If you are taking public transportation, you will need to have a responsible individual with you.  Please call the Pre-admissions Testing Dept. at [phone] if you have any questions about these instructions.  Surgery Visitation Policy:  Patients having surgery or a procedure may have two visitors.  Children under the age of 9 must have an adult with them who is not the patient.     Preparing for Surgery with CHLORHEXIDINE GLUCONATE (CHG) Soap  Chlorhexidine Gluconate (CHG) Soap  o An antiseptic cleaner that kills germs and bonds with the skin to continue killing germs even after washing  o Used for showering the night before surgery and morning of surgery  Before surgery, you can play an important role by reducing the number of germs on your skin.  CHG (Chlorhexidine gluconate) soap is an antiseptic cleanser which kills germs and bonds with the skin to continue killing germs even after washing.  Please do not use if you have an allergy to CHG or antibacterial soaps. If your skin becomes reddened/irritated stop using the CHG.  1. Shower the NIGHT BEFORE SURGERY and the MORNING OF SURGERY with CHG soap.  2. If you choose to wash your  hair, wash your hair first as usual with your normal shampoo.  3. After shampooing, rinse your hair and body thoroughly to remove the shampoo.  4. Use CHG as you would any other liquid soap. You can apply CHG directly to the skin and wash gently with a scrungie or a clean washcloth.  5. Apply the CHG soap to your body only from the neck down. Do not use on open wounds or open sores. Avoid contact with your eyes, ears, mouth, and genitals (private parts). Wash face and genitals (private parts) with your normal soap.  6. Wash thoroughly, paying special attention to the area where your surgery will be performed.  7. Thoroughly rinse your body with warm water.  8. Do not shower/wash with your normal soap after using and rinsing off the CHG soap.  9. Pat yourself dry with a clean towel.  10. Wear clean pajamas to bed the night before surgery.  12. Place clean sheets on your bed the night of your first shower and do not sleep with pets.   13. Shower again with the CHG soap on the day of surgery prior to arriving at  the hospital.  14. Do not apply any deodorants/lotions/powders.  15. Please wear clean clothes to the hospital.  How to Use an Incentive Spirometer An incentive spirometer is a tool that measures how well you are filling your lungs with each breath. Learning to take long, deep breaths using this tool can help you keep your lungs clear and active. This may help to reverse or lessen your chance of developing breathing (pulmonary) problems, especially infection. You may be asked to use a spirometer: After a surgery. If you have a lung problem or a history of smoking. After a long period of time when you have been unable to move or be active. If the spirometer includes an indicator to show the highest number that you have reached, your health care provider or respiratory therapist will help you set a goal. Keep a log of your progress as told by your health care provider. What are the  risks? Breathing too quickly may cause dizziness or cause you to pass out. Take your time so you do not get dizzy or light-headed. If you are in pain, you may need to take pain medicine before doing incentive spirometry. It is harder to take a deep breath if you are having pain. How to use your incentive spirometer  Sit up on the edge of your bed or on a chair. Hold the incentive spirometer so that it is in an upright position. Before you use the spirometer, breathe out normally. Place the mouthpiece in your mouth. Make sure your lips are closed tightly around it. Breathe in slowly and as deeply as you can through your mouth, causing the piston or the ball to rise toward the top of the chamber. Hold your breath for 3-5 seconds, or for as long as possible. If the spirometer includes a coach indicator, use this to guide you in breathing. Slow down your breathing if the indicator goes above the marked areas. Remove the mouthpiece from your mouth and breathe out normally. The piston or ball will return to the bottom of the chamber. Rest for a few seconds, then repeat the steps 10 or more times. Take your time and take a few normal breaths between deep breaths so that you do not get dizzy or light-headed. Do this every 1-2 hours when you are awake. If the spirometer includes a goal marker to show the highest number you have reached (best effort), use this as a goal to work toward during each repetition. After each set of 10 deep breaths, cough a few times. This will help to make sure that your lungs are clear. If you have an incision on your chest or abdomen from surgery, place a pillow or a rolled-up towel firmly against the incision when you cough. This can help to reduce pain while taking deep breaths and coughing. General tips When you are able to get out of bed: Walk around often. Continue to take deep breaths and cough in order to clear your lungs. Keep using the incentive spirometer until  your health care provider says it is okay to stop using it. If you have been in the hospital, you may be told to keep using the spirometer at home. Contact a health care provider if: You are having difficulty using the spirometer. You have trouble using the spirometer as often as instructed. Your pain medicine is not giving enough relief for you to use the spirometer as told. You have a fever. Get help right away if: You develop shortness of  breath. You develop a cough with bloody mucus from the lungs. You have fluid or blood coming from an incision site after you cough. Summary An incentive spirometer is a tool that can help you learn to take long, deep breaths to keep your lungs clear and active. You may be asked to use a spirometer after a surgery, if you have a lung problem or a history of smoking, or if you have been inactive for a long period of time. Use your incentive spirometer as instructed every 1-2 hours while you are awake. If you have an incision on your chest or abdomen, place a pillow or a rolled-up towel firmly against your incision when you cough. This will help to reduce pain. Get help right away if you have shortness of breath, you cough up bloody mucus, or blood comes from your incision when you cough. This information is not intended to replace advice given to you by your health care provider. Make sure you discuss any questions you have with your health care provider. Document Revised: 06/23/2019 Document Reviewed: 06/23/2019 Elsevier Patient Education  2024 [employer].

## 2023-03-02 ENCOUNTER — Encounter
Admission: RE | Admit: 2023-03-02 | Discharge: 2023-03-02 | Disposition: A | Discharge status: Home / Self Care | Payer: Medicare HMO | Source: Ambulatory Visit | Attending: Surgery | Admitting: Surgery

## 2023-03-02 DIAGNOSIS — D860 Sarcoidosis of lung: Secondary | ICD-10-CM | POA: Diagnosis not present

## 2023-03-02 DIAGNOSIS — Z01812 Encounter for preprocedural laboratory examination: Secondary | ICD-10-CM | POA: Insufficient documentation

## 2023-03-02 DIAGNOSIS — I10 Essential (primary) hypertension: Secondary | ICD-10-CM | POA: Diagnosis not present

## 2023-03-02 DIAGNOSIS — Z79899 Other long term (current) drug therapy: Secondary | ICD-10-CM | POA: Insufficient documentation

## 2023-03-02 LAB — BASIC METABOLIC PANEL
Anion gap: 11 (ref 5–15)
BUN: 30 mg/dL — ABNORMAL HIGH (ref 8–23)
CO2: 20 mmol/L — ABNORMAL LOW (ref 22–32)
Calcium: 9.4 mg/dL (ref 8.9–10.3)
Chloride: 107 mmol/L (ref 98–111)
Creatinine, Ser: 1.67 mg/dL — ABNORMAL HIGH (ref 0.44–1.00)
GFR, Estimated: 33 mL/min — ABNORMAL LOW (ref >60)
Glucose, Bld: 202 mg/dL — ABNORMAL HIGH (ref 70–99)
Potassium: 3.7 mmol/L (ref 3.5–5.1)
Sodium: 138 mmol/L (ref 135–145)

## 2023-03-05 NOTE — Progress Notes (Signed)
  Perioperative Services Pre-Admission/Anesthesia Testing    Date: 03/05/23  Name: Rachael Barnes MRN:   [id number]  Re: GLP-1 clearance and provider recommendations   Planned Surgical Procedure(s):    Case: [id number] Date/Time: 03/08/23 1253   Procedure: SHOULDER ARTHROSCOPY WITH DEBRIDEMENT, DECOMPRESSION, ROTATOR CUFF REPAIR, DISTAL CLAVIVLE EXCISION AND BICEPS TENODESIS. 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 In review of her medication reconciliation it was noted that patient is on a prescribed GLP-1 medication. Per guidelines issued by the American Society of Anesthesiologists (ASA), it is recommended that these medications be held for 7 days prior to the patient undergoing any type of elective surgical procedure. The patient is taking the following GLP-1 medication:  [x]  SEMAGLUTIDE   []  EXENATIDE  []  LIRAGLUTIDE   []  LIXISENATIDE  []  DULAGLUTIDE     []  TIRZEPATIDE (GLP-1/GIP)  Reached out to prescribing provider Letitia Libra, MD) to make them aware of the guidelines from anesthesia. Given that this patient takes the prescribed GLP-1 medication for her  diabetes diagnosis, rather than for weight loss, recommendations from the prescribing provider were solicited. Prescribing provider made aware of the following so that informed decision/POC can be developed for this patient that may be taking medications belonging to these drug classes:  Oral GLP-1 medications will be held 1 day prior to surgery.  Injectable GLP-1  medications will be held 7 days prior to surgery.  Metformin is routinely held 48 hours prior to surgery due to renal concerns, potential need for contrasted imaging perioperatively, and the potential for tissue hypoxia leading to drug induced lactic acidosis.  All SGLT2i medications are held 72 hours prior to surgery as they can be associated with the increased potential for developing euglycemic diabetic ketoacidosis (EDKA).   Impression and Plan:  [name] is on a prescribed GLP-1 medication, which induces the known side effect of decreased gastric emptying. Efforts are bring made to mitigate the risk of perioperative hyperglycemic events, as elevated blood glucose levels have been found to contribute to intra/postoperative complications. Additionally, hyperglycemic extremes can potentially necessitate the postponing of a patient's elective case in order to better optimize perioperative glycemic control, again with the aforementioned guidelines in place. With this in mind, recommendations have been sought from the prescribing provider, who has cleared patient to proceed with holding the prescribed GLP-1 as per the guidelines from the ASA.   Provider recommending: no further recommendations received from the prescribing provider.  Copy of signed clearance and recommendations placed on patient's chart for inclusion in their medical record and for review by the surgical/anesthetic team on the day of her procedure.   Quentin Mulling, MSN, APRN, FNP-C, CEN [facility]  Perioperative Services Nurse Practitioner Phone: [phone] Fax: [phone] 03/05/23 3:18 PM  NOTE: This note has been prepared using Dragon dictation software. Despite my best ability to proofread, there is always the potential that unintentional transcriptional errors may still occur from this process.

## 2023-03-07 MED ORDER — CHLORHEXIDINE GLUCONATE 0.12 % MT SOLN
15.0000 mL | Freq: Once | OROMUCOSAL | Status: AC
  Administered 2023-03-08: 15 mL via OROMUCOSAL

## 2023-03-07 MED ORDER — CEFAZOLIN SODIUM-DEXTROSE 2-4 GM/100ML-% IV SOLN
2.0000 g | INTRAVENOUS | Status: AC
  Administered 2023-03-08: 2 g via INTRAVENOUS

## 2023-03-07 MED ORDER — ORAL CARE MOUTH RINSE: 15.0000 mL | Freq: Once | OROMUCOSAL | Status: AC

## 2023-03-07 MED ORDER — SODIUM CHLORIDE 0.9 % IV SOLN: INTRAVENOUS | Status: DC

## 2023-03-08 ENCOUNTER — Ambulatory Visit
Admission: RE | Admit: 2023-03-08 | Discharge: 2023-03-08 | Disposition: A | Discharge status: Home / Self Care | Payer: Medicare HMO | Attending: Surgery | Admitting: Surgery

## 2023-03-08 ENCOUNTER — Ambulatory Visit: Payer: Self-pay | Admitting: Urgent Care

## 2023-03-08 ENCOUNTER — Ambulatory Visit: Payer: Medicare HMO | Admitting: Urgent Care

## 2023-03-08 ENCOUNTER — Other Ambulatory Visit: Payer: Self-pay

## 2023-03-08 ENCOUNTER — Ambulatory Visit: Payer: Medicare HMO

## 2023-03-08 ENCOUNTER — Encounter: Payer: Self-pay | Admitting: Surgery

## 2023-03-08 ENCOUNTER — Encounter
Admission: RE | Disposition: A | Discharge status: Home / Self Care | Payer: Self-pay | Source: Home / Self Care | Attending: Surgery

## 2023-03-08 DIAGNOSIS — K219 Gastro-esophageal reflux disease without esophagitis: Secondary | ICD-10-CM | POA: Insufficient documentation

## 2023-03-08 DIAGNOSIS — N184 Chronic kidney disease, stage 4 (severe): Secondary | ICD-10-CM | POA: Insufficient documentation

## 2023-03-08 DIAGNOSIS — M19011 Primary osteoarthritis, right shoulder: Secondary | ICD-10-CM | POA: Diagnosis not present

## 2023-03-08 DIAGNOSIS — Z6839 Body mass index (BMI) 39.0-39.9, adult: Secondary | ICD-10-CM | POA: Insufficient documentation

## 2023-03-08 DIAGNOSIS — Z01818 Encounter for other preprocedural examination: Secondary | ICD-10-CM

## 2023-03-08 DIAGNOSIS — E1122 Type 2 diabetes mellitus with diabetic chronic kidney disease: Secondary | ICD-10-CM | POA: Diagnosis not present

## 2023-03-08 DIAGNOSIS — G4733 Obstructive sleep apnea (adult) (pediatric): Secondary | ICD-10-CM | POA: Insufficient documentation

## 2023-03-08 DIAGNOSIS — E669 Obesity, unspecified: Secondary | ICD-10-CM | POA: Insufficient documentation

## 2023-03-08 DIAGNOSIS — I129 Hypertensive chronic kidney disease with stage 1 through stage 4 chronic kidney disease, or unspecified chronic kidney disease: Secondary | ICD-10-CM | POA: Diagnosis not present

## 2023-03-08 DIAGNOSIS — Z87891 Personal history of nicotine dependence: Secondary | ICD-10-CM | POA: Diagnosis not present

## 2023-03-08 DIAGNOSIS — S43431A Superior glenoid labrum lesion of right shoulder, initial encounter: Secondary | ICD-10-CM | POA: Insufficient documentation

## 2023-03-08 DIAGNOSIS — M25811 Other specified joint disorders, right shoulder: Secondary | ICD-10-CM | POA: Insufficient documentation

## 2023-03-08 DIAGNOSIS — M75121 Complete rotator cuff tear or rupture of right shoulder, not specified as traumatic: Secondary | ICD-10-CM | POA: Diagnosis present

## 2023-03-08 DIAGNOSIS — M7501 Adhesive capsulitis of right shoulder: Secondary | ICD-10-CM | POA: Insufficient documentation

## 2023-03-08 HISTORY — PX: SHOULDER ARTHROSCOPY WITH SUBACROMIAL DECOMPRESSION, ROTATOR CUFF REPAIR AND BICEP TENDON REPAIR: SHX5687

## 2023-03-08 LAB — GLUCOSE, CAPILLARY
Glucose-Capillary: 139 mg/dL — ABNORMAL HIGH (ref 70–99)
Glucose-Capillary: 171 mg/dL — ABNORMAL HIGH (ref 70–99)

## 2023-03-08 SURGERY — SHOULDER ARTHROSCOPY WITH SUBACROMIAL DECOMPRESSION, ROTATOR CUFF REPAIR AND BICEP TENDON REPAIR
Anesthesia: General | Site: Shoulder | Laterality: Right

## 2023-03-08 MED ORDER — BUPIVACAINE-EPINEPHRINE (PF) 0.5% -1:200000 IJ SOLN
INTRAMUSCULAR | Status: AC
Start: 2023-03-08 — End: ?
  Filled 2023-03-08: qty 30

## 2023-03-08 MED ORDER — EPHEDRINE 5 MG/ML INJ
INTRAVENOUS | Status: AC
  Filled 2023-03-08: qty 5

## 2023-03-08 MED ORDER — KETOROLAC TROMETHAMINE 15 MG/ML IJ SOLN
15.0000 mg | Freq: Once | INTRAMUSCULAR | Status: AC
  Administered 2023-03-08: 15 mg via INTRAVENOUS

## 2023-03-08 MED ORDER — LIDOCAINE HCL (PF) 2 % IJ SOLN
INTRAMUSCULAR | Status: AC
Start: 2023-03-08 — End: ?
  Filled 2023-03-08: qty 5

## 2023-03-08 MED ORDER — METOCLOPRAMIDE HCL 5 MG/ML IJ SOLN: 5.0000 mg | Freq: Three times a day (TID) | INTRAMUSCULAR | Status: DC | PRN

## 2023-03-08 MED ORDER — PHENYLEPHRINE 80 MCG/ML (10ML) SYRINGE FOR IV PUSH (FOR BLOOD PRESSURE SUPPORT)
PREFILLED_SYRINGE | INTRAVENOUS | Status: DC | PRN
  Administered 2023-03-08 (×6): 80 ug via INTRAVENOUS

## 2023-03-08 MED ORDER — BUPIVACAINE-EPINEPHRINE 0.5% -1:200000 IJ SOLN
INTRAMUSCULAR | Status: DC | PRN
  Administered 2023-03-08: 30 mL

## 2023-03-08 MED ORDER — KETOROLAC TROMETHAMINE 15 MG/ML IJ SOLN
INTRAMUSCULAR | Status: AC
Start: 2023-03-08 — End: ?
  Filled 2023-03-08: qty 1

## 2023-03-08 MED ORDER — BUPIVACAINE LIPOSOME 1.3 % IJ SUSP
INTRAMUSCULAR | Status: DC | PRN
  Administered 2023-03-08: 20 mL

## 2023-03-08 MED ORDER — RINGERS IRRIGATION IR SOLN
Status: DC | PRN
  Administered 2023-03-08: 3000 mL
  Administered 2023-03-08: 2500 mL

## 2023-03-08 MED ORDER — GLYCOPYRROLATE 0.2 MG/ML IJ SOLN
INTRAMUSCULAR | Status: DC | PRN
  Administered 2023-03-08: .2 mg via INTRAVENOUS

## 2023-03-08 MED ORDER — PROPOFOL 10 MG/ML IV BOLUS
INTRAVENOUS | Status: DC | PRN
  Administered 2023-03-08: 150 mg via INTRAVENOUS

## 2023-03-08 MED ORDER — OXYCODONE HCL 5 MG PO TABS: 5.0000 mg | ORAL_TABLET | ORAL | 0 refills | Status: AC | PRN

## 2023-03-08 MED ORDER — FENTANYL CITRATE (PF) 100 MCG/2ML IJ SOLN
INTRAMUSCULAR | Status: AC
  Filled 2023-03-08: qty 2

## 2023-03-08 MED ORDER — BUPIVACAINE LIPOSOME 1.3 % IJ SUSP
INTRAMUSCULAR | Status: AC
  Filled 2023-03-08: qty 20

## 2023-03-08 MED ORDER — SODIUM CHLORIDE 0.9 % IV SOLN
INTRAVENOUS | Status: DC
Start: 2023-03-08 — End: 2023-03-08

## 2023-03-08 MED ORDER — LACTATED RINGERS IV SOLN
INTRAVENOUS | Status: DC | PRN
  Administered 2023-03-08: 3001 mL

## 2023-03-08 MED ORDER — BUPIVACAINE HCL (PF) 0.5 % IJ SOLN
INTRAMUSCULAR | Status: DC | PRN
  Administered 2023-03-08: 10 mL

## 2023-03-08 MED ORDER — PROPOFOL 10 MG/ML IV BOLUS
INTRAVENOUS | Status: AC
  Filled 2023-03-08: qty 20

## 2023-03-08 MED ORDER — FENTANYL CITRATE (PF) 100 MCG/2ML IJ SOLN: 25.0000 ug | INTRAMUSCULAR | Status: DC | PRN

## 2023-03-08 MED ORDER — LIDOCAINE HCL (PF) 1 % IJ SOLN
INTRAMUSCULAR | Status: AC
  Filled 2023-03-08: qty 5

## 2023-03-08 MED ORDER — OXYCODONE HCL 5 MG/5ML PO SOLN: 5.0000 mg | Freq: Once | ORAL | Status: DC | PRN

## 2023-03-08 MED ORDER — ONDANSETRON HCL 4 MG/2ML IJ SOLN
INTRAMUSCULAR | Status: AC
Start: 2023-03-08 — End: ?
  Filled 2023-03-08: qty 2

## 2023-03-08 MED ORDER — ROCURONIUM BROMIDE 100 MG/10ML IV SOLN
INTRAVENOUS | Status: DC | PRN
  Administered 2023-03-08: 70 mg via INTRAVENOUS

## 2023-03-08 MED ORDER — OXYCODONE HCL 5 MG PO TABS: 5.0000 mg | ORAL_TABLET | ORAL | Status: DC | PRN

## 2023-03-08 MED ORDER — BUPIVACAINE HCL (PF) 0.5 % IJ SOLN
INTRAMUSCULAR | Status: AC
  Filled 2023-03-08: qty 10

## 2023-03-08 MED ORDER — ONDANSETRON HCL 4 MG PO TABS: 4.0000 mg | ORAL_TABLET | Freq: Four times a day (QID) | ORAL | Status: DC | PRN

## 2023-03-08 MED ORDER — PHENYLEPHRINE 80 MCG/ML (10ML) SYRINGE FOR IV PUSH (FOR BLOOD PRESSURE SUPPORT)
PREFILLED_SYRINGE | INTRAVENOUS | Status: AC
Start: 2023-03-08 — End: ?
  Filled 2023-03-08: qty 10

## 2023-03-08 MED ORDER — DEXAMETHASONE SODIUM PHOSPHATE 10 MG/ML IJ SOLN
INTRAMUSCULAR | Status: DC | PRN
  Administered 2023-03-08: 5 mg via INTRAVENOUS

## 2023-03-08 MED ORDER — EPINEPHRINE PF 1 MG/ML IJ SOLN
INTRAMUSCULAR | Status: AC
Start: 2023-03-08 — End: ?
  Filled 2023-03-08: qty 1

## 2023-03-08 MED ORDER — FENTANYL CITRATE (PF) 100 MCG/2ML IJ SOLN
INTRAMUSCULAR | Status: DC | PRN
  Administered 2023-03-08 (×2): 50 ug via INTRAVENOUS

## 2023-03-08 MED ORDER — CEFAZOLIN SODIUM-DEXTROSE 2-4 GM/100ML-% IV SOLN
INTRAVENOUS | Status: AC
  Filled 2023-03-08: qty 100

## 2023-03-08 MED ORDER — DEXAMETHASONE SODIUM PHOSPHATE 10 MG/ML IJ SOLN
INTRAMUSCULAR | Status: AC
  Filled 2023-03-08: qty 1

## 2023-03-08 MED ORDER — FENTANYL CITRATE PF 50 MCG/ML IJ SOSY
PREFILLED_SYRINGE | INTRAMUSCULAR | Status: AC
  Filled 2023-03-08: qty 1

## 2023-03-08 MED ORDER — ONDANSETRON HCL 4 MG/2ML IJ SOLN: 4.0000 mg | Freq: Four times a day (QID) | INTRAMUSCULAR | Status: DC | PRN

## 2023-03-08 MED ORDER — OXYCODONE HCL 5 MG PO TABS: 5.0000 mg | ORAL_TABLET | Freq: Once | ORAL | Status: DC | PRN

## 2023-03-08 MED ORDER — MIDAZOLAM HCL 2 MG/2ML IJ SOLN
1.0000 mg | INTRAMUSCULAR | Status: DC | PRN
  Administered 2023-03-08: 1 mg via INTRAVENOUS

## 2023-03-08 MED ORDER — FENTANYL CITRATE PF 50 MCG/ML IJ SOSY
50.0000 ug | PREFILLED_SYRINGE | Freq: Once | INTRAMUSCULAR | Status: AC
  Administered 2023-03-08: 50 ug via INTRAVENOUS

## 2023-03-08 MED ORDER — EPHEDRINE SULFATE-NACL 50-0.9 MG/10ML-% IV SOSY
PREFILLED_SYRINGE | INTRAVENOUS | Status: DC | PRN
  Administered 2023-03-08: 10 mg via INTRAVENOUS
  Administered 2023-03-08: 5 mg via INTRAVENOUS

## 2023-03-08 MED ORDER — METOCLOPRAMIDE HCL 10 MG PO TABS: 5.0000 mg | ORAL_TABLET | Freq: Three times a day (TID) | ORAL | Status: DC | PRN

## 2023-03-08 MED ORDER — ROCURONIUM BROMIDE 10 MG/ML (PF) SYRINGE
PREFILLED_SYRINGE | INTRAVENOUS | Status: AC
Start: 2023-03-08 — End: ?
  Filled 2023-03-08: qty 10

## 2023-03-08 MED ORDER — CHLORHEXIDINE GLUCONATE 0.12 % MT SOLN
OROMUCOSAL | Status: AC
Start: 2023-03-08 — End: ?
  Filled 2023-03-08: qty 15

## 2023-03-08 MED ORDER — ONDANSETRON HCL 4 MG/2ML IJ SOLN
INTRAMUSCULAR | Status: DC | PRN
  Administered 2023-03-08: 4 mg via INTRAVENOUS

## 2023-03-08 MED ORDER — ACETAMINOPHEN 325 MG PO TABS
325.0000 mg | ORAL_TABLET | Freq: Four times a day (QID) | ORAL | Status: DC | PRN
Start: 2023-03-09 — End: 2023-03-08

## 2023-03-08 MED ORDER — MIDAZOLAM HCL 2 MG/2ML IJ SOLN
INTRAMUSCULAR | Status: AC
Start: 2023-03-08 — End: ?
  Filled 2023-03-08: qty 2

## 2023-03-08 MED ORDER — GLYCOPYRROLATE 0.2 MG/ML IJ SOLN
INTRAMUSCULAR | Status: AC
  Filled 2023-03-08: qty 1

## 2023-03-08 MED ORDER — SUGAMMADEX SODIUM 200 MG/2ML IV SOLN
INTRAVENOUS | Status: DC | PRN
  Administered 2023-03-08: 200 mg via INTRAVENOUS

## 2023-03-08 SURGICAL SUPPLY — 45 items
ANCHOR HEALICOIL REGEN 5.5 (Anchor) IMPLANT
ANCHOR QFIX 2.8 SUT MINI TAPE (Anchor) IMPLANT
BIT DRILL JUGRKNT W/NDL BIT2.9 (DRILL) IMPLANT
BLADE FULL RADIUS 3.5 (BLADE) ×1 IMPLANT
BUR ACROMIONIZER 4.0 (BURR) ×1 IMPLANT
CHLORAPREP W/TINT 26 (MISCELLANEOUS) ×1 IMPLANT
COVER MAYO STAND STRL (DRAPES) ×1 IMPLANT
DILATOR 5.5 THREADED HEALICOIL (MISCELLANEOUS) IMPLANT
DRILL JUGGERKNOT W/NDL BIT 2.9 (DRILL)
ELECT CAUTERY BLADE 6.4 (BLADE) ×1 IMPLANT
ELECT REM PT RETURN 9FT ADLT (ELECTROSURGICAL) ×1
ELECTRODE REM PT RTRN 9FT ADLT (ELECTROSURGICAL) ×1 IMPLANT
GAUZE SPONGE 4X4 12PLY STRL (GAUZE/BANDAGES/DRESSINGS) ×1 IMPLANT
GAUZE XEROFORM 1X8 LF (GAUZE/BANDAGES/DRESSINGS) ×1 IMPLANT
GLOVE BIO SURGEON STRL SZ7.5 (GLOVE) ×2 IMPLANT
GLOVE BIO SURGEON STRL SZ8 (GLOVE) ×2 IMPLANT
GLOVE BIOGEL PI IND STRL 8 (GLOVE) ×1 IMPLANT
GLOVE INDICATOR 8.0 STRL GRN (GLOVE) ×1 IMPLANT
GOWN STRL REUS W/ TWL LRG LVL3 (GOWN DISPOSABLE) ×1 IMPLANT
GOWN STRL REUS W/ TWL XL LVL3 (GOWN DISPOSABLE) ×1 IMPLANT
GRASPER SUT 15 45D LOW PRO (SUTURE) IMPLANT
IV LR IRRIG 3000ML ARTHROMATIC (IV SOLUTION) ×2 IMPLANT
KIT CANNULA 8X76-LX IN CANNULA (CANNULA) ×1 IMPLANT
KIT SUTURE 2.8 Q-FIX DISP (MISCELLANEOUS) IMPLANT
MANIFOLD NEPTUNE II (INSTRUMENTS) ×2 IMPLANT
MASK FACE SPIDER DISP (MASK) ×1 IMPLANT
MAT ABSORB FLUID 56X50 GRAY (MISCELLANEOUS) ×1 IMPLANT
PACK ARTHROSCOPY SHOULDER (MISCELLANEOUS) ×1 IMPLANT
PAD ABD DERMACEA PRESS 5X9 (GAUZE/BANDAGES/DRESSINGS) ×2 IMPLANT
PASSER SUT FIRSTPASS SELF (INSTRUMENTS) IMPLANT
SLEEVE REMOTE CONTROL 5X12 (DRAPES) IMPLANT
SLING ARM LRG DEEP (SOFTGOODS) ×1 IMPLANT
SLING ULTRA II LG (MISCELLANEOUS) ×1 IMPLANT
SPONGE T-LAP 18X18 ~~LOC~~+RFID (SPONGE) ×1 IMPLANT
STAPLER SKIN PROX 35W (STAPLE) ×1 IMPLANT
STRAP SAFETY 5IN WIDE (MISCELLANEOUS) ×1 IMPLANT
SUT ETHIBOND 0 MO6 C/R (SUTURE) ×1 IMPLANT
SUT ULTRABRAID 2 COBRAID 38 (SUTURE) IMPLANT
SUT VIC AB 2-0 CT1 TAPERPNT 27 (SUTURE) ×2 IMPLANT
TAPE MICROFOAM 4IN (TAPE) ×1 IMPLANT
TRAP FLUID SMOKE EVACUATOR (MISCELLANEOUS) ×1 IMPLANT
TUBE SET DOUBLEFLO INFLOW (TUBING) ×1 IMPLANT
TUBING CONNECTING 10 (TUBING) ×1 IMPLANT
WAND WEREWOLF FLOW 90D (MISCELLANEOUS) ×1 IMPLANT
WATER STERILE IRR 500ML POUR (IV SOLUTION) ×1 IMPLANT

## 2023-03-08 NOTE — Discharge Instructions (Addendum)
Orthopedic discharge instructions: Keep dressing dry and intact.  May shower after dressing changed on post-op day #4 (Monday).  Cover staples with Band-Aids after drying off. Apply ice frequently to shoulder. Take ibuprofen 600-800 mg TID with meals for 3-5 days, then as necessary. Take oxycodone as prescribed when needed.  May supplement with ES Tylenol if necessary. Keep shoulder immobilizer on at all times except may remove for bathing purposes. Follow-up in 10-14 days or as scheduled.  SHOULDER SLING IMMOBILIZER   VIDEO Slingshot 2 Shoulder Brace Application - YouTube ---https://www.porter.info/  INSTRUCTIONS While supporting the injured arm, slide the forearm into the sling. Wrap the adjustable shoulder strap around the neck and shoulders and attach the strap end to the sling using  the "alligator strap tab."  Adjust the shoulder strap to the required length. Position the shoulder pad behind the neck. To secure the shoulder pad location (optional), pull the shoulder strap away from the shoulder pad, unfold the hook material on the top of the pad, then press the shoulder strap back onto the hook material to secure the pad in place. Attach the closure strap across the open top of the sling. Position the strap so that it holds the arm securely in the sling. Next, attach the thumb strap to the open end of the sling between the thumb and fingers. After sling has been fit, it may be easily removed and reapplied using the quick release buckle on shoulder strap. If a neutral pillow or 15 abduction pillow is included, place the pillow at the waistline. Attach the sling to the pillow, lining up hook material on the pillow with the loop on sling. Adjust the waist strap to fit.  If waist strap is too long, cut it to fit. Use the small piece of double sided hook material (located on top of the pillow) to secure the strap end. Place the double sided hook material on the inside of  the cut strap end and secure it to the waist strap.     If no pillow is included, attach the waist strap to the sling and adjust to fit.    Washing Instructions: Straps and sling must be removed and cleaned regularly depending on your activity level and perspiration. Hand wash straps and sling in cold water with mild detergent, rinse, air dry        Interscalene Nerve Block with Exparel   For your surgery you have received an Interscalene Nerve Block with Exparel. Nerve Blocks affect many types of nerves, including nerves that control movement, pain and normal sensation.  You may experience feelings such as numbness, tingling, heaviness, weakness or the inability to move your arm or the feeling or sensation that your arm has "fallen asleep". A nerve block with Exparel can last up to 5 days.  Usually the weakness wears off first.  The tingling and heaviness usually wear off next.  Finally you may start to notice pain.  Keep in mind that this may occur in any order.  Once a nerve block starts to wear off it is usually completely gone within 60 minutes. ISNB may cause mild shortness of breath, a hoarse voice, blurry vision, unequal pupils, or drooping of the face on the same side as the nerve block.  These symptoms will usually resolve with the numbness.  Very rarely the procedure itself can cause mild seizures. If needed, your surgeon will give you a prescription for pain medication.  It will take about 60 minutes for the oral  pain medication to become fully effective.  So, it is recommended that you start taking this medication before the nerve block first begins to wear off, or when you first begin to feel discomfort. Take your pain medication only as prescribed.  Pain medication can cause sedation and decrease your breathing if you take more than you need for the level of pain that you have. Nausea is a common side effect of many pain medications.  You may want to eat something before taking your  pain medicine to prevent nausea. After an Interscalene nerve block, you cannot feel pain, pressure or extremes in temperature in the effected arm.  Because your arm is numb it is at an increased risk for injury.  To decrease the possibility of injury, please practice the following:  While you are awake change the position of your arm frequently to prevent too much pressure on any one area for prolonged periods of time.  If you have a cast or tight dressing, check the color or your fingers every couple of hours.  Call your surgeon with the appearance of any discoloration (white or blue). If you are given a sling to wear before you go home, please wear it  at all times until the block has completely worn off.  Do not get up at night without your sling. Please contact [facility] or your surgeon if you do not begin to regain sensation after 7 days from the surgery.  Anesthesia may be contacted by calling the Same Day Surgery Department, Mon. through Fri., 6 am to 4 pm at [phone].   If you experience any other problems or concerns, please contact your surgeon's office. If you experience severe or prolonged shortness of breath go to the nearest emergency department.

## 2023-03-08 NOTE — Transfer of Care (Signed)
Immediate Anesthesia Transfer of Care Note  Patient: Carlita Mcphail  Procedure(s) Performed: SHOULDER ARTHROSCOPY WITH DEBRIDEMENT, DECOMPRESSION, ROTATOR CUFF REPAIR, DISTAL CLAVIVLE EXCISION AND BICEPS TENODESIS. (Right: Shoulder)  Patient Location: PACU  Anesthesia Type:General  Level of Consciousness: awake, alert , and drowsy  Airway & Oxygen Therapy: Patient Spontanous Breathing and Patient connected to face mask oxygen  Post-op Assessment: Report given to RN and Post -op Vital signs reviewed and stable  Post vital signs: Reviewed and stable  Last Vitals:  Vitals Value Taken Time  BP 122/85 03/08/23 1543  Temp 35.9 C 03/08/23 1543  Pulse 74 03/08/23 1544  Resp 22 03/08/23 1546  SpO2 95 % 03/08/23 1544  Vitals shown include unfiled device data.  Last Pain:  Vitals:   03/08/23 1136  TempSrc: Temporal  PainSc: 0-No pain         Complications: No notable events documented.

## 2023-03-08 NOTE — Anesthesia Procedure Notes (Signed)
Anesthesia Regional Block: Interscalene brachial plexus block   Pre-Anesthetic Checklist: , timeout performed,  Correct Patient, Correct Site, Correct Laterality,  Correct Procedure, Correct Position, site marked,  Risks and benefits discussed,  Surgical consent,  Pre-op evaluation,  At surgeon's request and post-op pain management  Laterality: Right  Prep: chloraprep       Needles:  Injection technique: Single-shot  Needle Type: Echogenic Needle     Needle Length: 4cm  Needle Gauge: 25     Additional Needles:   Procedures:,,,, ultrasound used (permanent image in chart),,    Narrative:  Start time: 03/08/2023 12:36 PM End time: 03/08/2023 12:38 PM Injection made incrementally with aspirations every 5 mL.  Performed by: Personally  Anesthesiologist: Stephanie Coup, MD  Additional Notes: Patient's chart reviewed and they were deemed appropriate candidate for procedure, at surgeon's request. Patient educated about risks, benefits, and alternatives of the block including but not limited to: temporary or permanent nerve damage, bleeding, infection, damage to surround tissues, pneumothorax, hemidiaphragmatic paralysis, unilateral Horner's syndrome, block failure, local anesthetic toxicity. Patient expressed understanding. A formal time-out was conducted consistent with institution rules.  Monitors were applied, and minimal sedation used (see nursing record). The site was prepped with skin prep and allowed to dry, and sterile gloves were used. A high frequency linear ultrasound probe with probe cover was utilized throughout. C5-7 nerve roots located and appeared anatomically normal, local anesthetic injected around them, and echogenic block needle trajectory was monitored throughout. Aspiration performed every 5ml. Lung and blood vessels were avoided. All injections were performed without resistance and free of blood and paresthesias. The patient tolerated the procedure  well.  Injectate: 20ml exparel + 10ml 0.5% bupivacaine

## 2023-03-08 NOTE — Anesthesia Preprocedure Evaluation (Signed)
Anesthesia Evaluation  Patient identified by MRN, date of birth, ID band Patient awake    Reviewed: Allergy & Precautions, NPO status , Patient's Chart, lab work & pertinent test results  Airway Mallampati: III  TM Distance: >3 FB Neck ROM: full    Dental  (+) Chipped, Dental Advidsory Given   Pulmonary with exertion, asthma , sleep apnea and Continuous Positive Airway Pressure Ventilation , former smoker   Pulmonary exam normal        Cardiovascular Exercise Tolerance: Good hypertension, (-) angina (-) Past MI and (-) DOE negative cardio ROS Normal cardiovascular exam     Neuro/Psych negative neurological ROS  negative psych ROS   GI/Hepatic Neg liver ROS,GERD  Controlled,,  Endo/Other  diabetes, Type 2Hypothyroidism    Renal/GU CRFRenal disease     Musculoskeletal   Abdominal   Peds  Hematology negative hematology ROS (+)   Anesthesia Other Findings Past Medical History: No date: Acute pyelonephritis, antepartum, unspecified trimester No date: Acute renal failure superimposed on stage 4 chronic kidney  disease (HCC) No date: Anemia No date: Battery end of life of spinal cord stimulator No date: Biceps tendonitis No date: BPPV (benign paroxysmal positional vertigo) No date: Bronchitis No date: CKD (chronic kidney disease) No date: DM (diabetes mellitus), type 2 (HCC) No date: Dyspnea No date: GERD (gastroesophageal reflux disease) No date: Glaucoma     Comment:  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 No date: History of migraine No date: HTN (hypertension) No date: Hypothyroidism No date: Infraspinatus tendon tear No date: Macular degeneration No date: Obesity No date: OSA on CPAP No date: Pulmonary sarcoidosis (HCC) No date: Rotator cuff tendinitis No date: Spinal stenosis No date: Type 2 diabetes mellitus without complication, without long- term current use of insulin (HCC)  Past Surgical  History: No date: APPENDECTOMY No date: CARPAL TUNNEL RELEASE; Left No date: CHOLECYSTECTOMY No date: COLONOSCOPY 08/24/2020: COLONOSCOPY WITH PROPOFOL; N/A     Comment:  Procedure: COLONOSCOPY WITH PROPOFOL;  Surgeon:               Regis Bill, MD;  Location: [facility];                Service: Endoscopy;  Laterality: N/A;  DM No date: EYE SURGERY; Bilateral     Comment:  cataracts No date: GLAUCOMA VALVE INSERTION; Right No date: NEUROMA SURGERY; Left     Comment:  foot No date: SHOULDER SURGERY; Left     Comment:  x2 09/06/2022: SPINAL CORD STIMULATOR BATTERY EXCHANGE; N/A     Comment:  Procedure: REPLACE INTERNAL PULSE GENERATOR ([place]               SCIENTIFIC);  Surgeon: Venetia Night, MD;  Location:              [facility] ORS;  Service: Neurosurgery;  Laterality: N/A;                local with MAC No date: SPINAL CORD STIMULATOR INSERTION  BMI    Body Mass Index: 39.58 kg/m      Reproductive/Obstetrics negative OB ROS                             Anesthesia Physical Anesthesia Plan  ASA: 3  Anesthesia Plan: General   Post-op Pain Management:    Induction: Intravenous  PONV Risk Score and Plan: 3 and Ondansetron, Dexamethasone and Midazolam  Airway Management Planned:  Oral ETT  Additional Equipment:   Intra-op Plan:   Post-operative Plan:   Informed Consent: I have reviewed the patients History and Physical, chart, labs and discussed the procedure including the risks, benefits and alternatives for the proposed anesthesia with the patient or authorized representative who has indicated his/her understanding and acceptance.     Dental Advisory Given  Plan Discussed with: Anesthesiologist, CRNA and Surgeon  Anesthesia Plan Comments: (Thorough discussion with patient about the depth of anesthesia that she will receive today.  That our goal will not be full GA with a natural airway. That she may remember the procedure.  Patient voiced assent.  Patient consented for risks of anesthesia including but not limited to:  - adverse reactions to medications - risk of airway placement if required - damage to eyes, teeth, lips or other oral mucosa - nerve damage due to positioning  - sore throat or hoarseness - Damage to heart, brain, nerves, lungs, other parts of body or loss of life  Patient voiced understanding.)       Anesthesia Quick Evaluation

## 2023-03-08 NOTE — Anesthesia Postprocedure Evaluation (Signed)
Anesthesia Post Note  Patient: Rachael Barnes  Procedure(s) Performed: SHOULDER ARTHROSCOPY WITH DEBRIDEMENT, DECOMPRESSION, ROTATOR CUFF REPAIR, DISTAL CLAVIVLE EXCISION AND BICEPS TENODESIS. (Right: Shoulder)  Patient location during evaluation: PACU Anesthesia Type: General Level of consciousness: awake and alert Pain management: pain level controlled Vital Signs Assessment: post-procedure vital signs reviewed and stable Respiratory status: spontaneous breathing, nonlabored ventilation, respiratory function stable and patient connected to nasal cannula oxygen Cardiovascular status: blood pressure returned to baseline and stable Postop Assessment: no apparent nausea or vomiting Anesthetic complications: no  No notable events documented.   Last Vitals:  Vitals:   03/08/23 1543 03/08/23 1600  BP: 122/85 138/70  Pulse: 73 72  Resp: 19   Temp: (!) 35.9 C   SpO2: 96% 95%    Last Pain:  Vitals:   03/08/23 1543  TempSrc:   PainSc: 0-No pain                 Stephanie Coup

## 2023-03-08 NOTE — Anesthesia Procedure Notes (Signed)
Procedure Name: Intubation Date/Time: 03/08/2023 1:34 PM  Performed by: Morene Crocker, CRNAPre-anesthesia Checklist: Patient identified, Patient being monitored, Timeout performed, Emergency Drugs available and Suction available Patient Re-evaluated:Patient Re-evaluated prior to induction Oxygen Delivery Method: Circle system utilized Preoxygenation: Pre-oxygenation with 100% oxygen Induction Type: IV induction Ventilation: Mask ventilation without difficulty Laryngoscope Size: McGrath and 3 Grade View: Grade I Tube type: Oral Tube size: 7.0 mm Number of attempts: 1 Airway Equipment and Method: Stylet Placement Confirmation: ETT inserted through vocal cords under direct vision, positive ETCO2 and breath sounds checked- equal and bilateral Secured at: 22 cm Tube secured with: Tape Dental Injury: Teeth and Oropharynx as per pre-operative assessment  Comments: Smooth atraumatic intubation, no complications noted.

## 2023-03-08 NOTE — H&P (Signed)
History of Present Illness: Rachael Barnes is a 72 y.o. female who resents today for repeat evaluation of ongoing right shoulder pain and discussion of recent right shoulder CT scan. The patient has been experiencing pain over the past several months, she did have an incident in which she fell off of a barstool last year but denies any specific injury or trauma affecting the right shoulder. The patient is right-hand dominant. The patient was initially evaluated earlier this year where her exam was concerning for potential rotator cuff pathology. The patient was sent for an MRI scan however she was unable to undergo the MRI scan due to her spinal cord stimulator and because of this a CT scan with contrast was ordered. The patient presents today to discuss results. The patient is still having difficulty when trying to reach above her head or out to the side. Pain is located both along the superior and anterior aspect the right shoulder. She is having difficulty reaching out to the side and gripping and lifting objects with the right hand. The patient does have a history of COPD. She does use a CPAP machine at night. She denies any personal history of heart attack or stroke. She is a diabetic, most recent A1c is 6.2. The patient denies any personal history of blood clots.  Past Medical History: CKD (chronic kidney disease)  Diabetes mellitus without complication (CMS/HHS-HCC)  GERD (gastroesophageal reflux disease)  Hypertension  Macular degeneration  Sarcoidosis  Sleep apnea  Spinal stenosis   Past Surgical History: CHOLECYSTECTOMY 1981  COLONOSCOPY 08/24/2020 (Diverticulosis/Otherwise normal/Repeat 83yrs/CTL)  APPENDECTOMY  Left Carpal Tunnel Release Left  Left Foot Neuroma Removed Left  Rotator Cuff Repair Left x2  Spinal Cord Simulator Implant   Past Family History: Diabetes Mother  Heart disease Mother  No Known Problems Father   Medications: albuterol (PROVENTIL) 2.5 mg /3 mL (0.083 %)  nebulizer solution Take 3 mLs (2.5 mg total) by nebulization every 6 (six) hours as needed for Wheezing 75 mL 2  amLODIPine (NORVASC) 10 MG tablet TAKE 1 TABLET BY MOUTH EVERY DAY 90 tablet 3  aspirin 81 MG EC tablet Take 81 mg by mouth once daily  atorvastatin (LIPITOR) 40 MG tablet take 1 tablet by mouth every day 90 tablet 1  blood glucose diagnostic test strip 1 each (1 strip total) 3 (three) times daily Use as instructed. 100 each 11  blood glucose meter kit as directed 1 each 0  brimonidine (ALPHAGAN) 0.2 % ophthalmic solution Apply to eye  cetirizine (ZYRTEC) 10 MG tablet Take 10 mg by mouth every morning  cholecalciferol, vitamin D3, (VITAMIN D3) 125 mcg (5,000 unit) tablet Take by mouth  docusate (COLACE) 250 MG capsule Take by mouth  dorzolamide-timoloL (COSOPT) 22.3-6.8 mg/mL ophthalmic solution Place 1 drop into both eyes 2 (two) times daily  flash glucose scanning (FREESTYLE LIBRE 14 DAY) reader Use 1 Device as directed (Patient not taking: Reported on 01/24/2023) 1 each 1  flash glucose sensor (FREESTYLE LIBRE 14 DAY SENSOR) kit Use 1 kit every 14 (fourteen) days for glucose monitoring (Patient not taking: Reported on 01/24/2023) 1 kit 3  fluticasone propionate (FLONASE) 50 mcg/actuation nasal spray Place 1 spray into both nostrils as needed  FUROsemide (LASIX) 40 MG tablet take 1 tablet by mouth every day 90 tablet 1  ibuprofen (MOTRIN) 200 MG tablet Take 200 mg by mouth every 6 (six) hours as needed for Pain  KLOR-CON M10 10 mEq ER tablet take 1 tablet by mouth every day 90  tablet 1  levothyroxine (SYNTHROID) 125 MCG tablet take 1 tablet by mouth every day 90 tablet 1  meclizine (ANTIVERT) 25 MG tablet Take by mouth (Patient not taking: Reported on 01/24/2023)  metFORMIN (GLUCOPHAGE) 1000 MG tablet take 1 tablet by mouth twice a day with food 180 tablet 1  mometasone-formoterol (DULERA) 100-5 mcg/actuation inhaler Inhale 2 Inhalations into the lungs as needed  montelukast  (SINGULAIR) 10 mg tablet take 1 tablet by mouth everyday at bedtime 90 tablet 1  netarsudiL (RHOPRESSA) 0.02 % eye drops Place 1 drop into the left eye at bedtime  omeprazole (PRILOSEC) 40 MG DR capsule take 1 capsule by mouth every day 90 capsule 1  ONETOUCH DELICA PLUS LANCET Use 1 each 3 (three) times daily Use as instructed. 100 each 5  semaglutide (OZEMPIC) 0.25 mg or 0.5 mg (2 mg/3 mL) pen injector INJECT 0.375 MLS (0.25 MG TOTAL) SUBCUTANEOUSLY ONCE A WEEK 3 mL 2  VYZULTA 0.024 % eye drops Place 1 drop into the left eye at bedtime   Allergies: Acetaminophen Diarrhea  Penicillin ("any ending in "cillin") Swelling   Review of Systems:  A comprehensive 14 point ROS was performed, reviewed by me today, and the pertinent orthopaedic findings are documented in the HPI.  Physical Exam: BP (!) 172/80  Ht 162.6 cm (5\' 4" )  Wt (!) 104.6 kg (230 lb 9.6 oz)  BMI 39.58 kg/m  General/Constitutional: The patient appears to be well-nourished, well-developed, and in no acute distress. Neuro/Psych: Normal mood and affect, oriented to person, place and time. Eyes: Non-icteric. Pupils are equal, round, and reactive to light, and exhibit synchronous movement. ENT: Unremarkable. Lymphatic: No palpable adenopathy. Respiratory: Lungs clear to auscultation, Normal chest excursion, No wheezes, and Non-labored breathing Cardiovascular: Regular rate and rhythm. No murmurs. and No edema, swelling or tenderness, except as noted in detailed exam. Integumentary: No impressive skin lesions present, except as noted in detailed exam. Musculoskeletal: Unremarkable, except as noted in detailed exam.  General: Well developed, well nourished 72 y.o. female in no apparent distress. Normal affect. Normal communication. Patient answers questions appropriately. The patient has a normal gait. There is no antalgic component. There is no hip lurch.   Right Upper Extremity: Examination of the right shoulder and arm showed  no bony abnormality or edema. The patient does have significant limitations with right shoulder range of motion exercises. With forward flexion she is able to achieve close to 80 degrees, with abduction closer to 70 degrees. She is not able to tolerate significant external or internal rotation at this time. She does have full passive range of motion at this time. Increased pain at the extremes of motion to the right shoulder. The patient has a positive Hawkins test and a positive impingement test. The patient has a positive drop arm test. Moderate tenderness with palpation over the right subacromial space and along the right anterior aspect of the shoulder over the proximal bicep tendon. The patient has no instability of the shoulder with anterior-posterior motion. There is a negative sulcus sign. The rotator cuff muscle strength is 3/5 with supraspinatus, 3-4/5 with internal rotation, and 3/5 with external rotation. There is mild crepitus with range of motion activities.   Neurological: The patient has sensation that is intact to light touch and pinprick bilaterally. The patient has normal grip strength. The patient has full biceps, wrist extension, grip, and interosseous strength. The patient has 2 + DTRs bilaterally.  Vascular: The patient has less than 2 second capillary refill. The patient  has normal ulnar and radial pulses. The patient has normal warmth to touch.   Imaging: True AP, Y-scapular, and axillary views of the right shoulder were obtained at a previous visit and reviewed today. These films demonstrate no evidence for fractures, lytic lesions, or significant degenerative changes involving the glenohumeral joint. The subacromial space is well-maintained. There is some arthritic changes involving the right AC joint with mild spur formation. She demonstrates a Type I acromion.  CT ARTHROGRAPHY OF THE RIGHT SHOULDER:  1. Diffuse articular surface irregularity of the supraspinatus and   infraspinatus tendons with a small full-thickness, nonretracted tear  of the infraspinatus tendon. No significant tendon retraction or  focal muscular atrophy.  2. Small SLAP 2 tear of the superior labrum.  3. Mild-to-moderate acromioclavicular degenerative changes. No acute  osseous findings.   Impression: 1. Right rotator cuff tendonitis. 2. Nontraumatic complete tear of right rotator cuff. 3. Biceps tendonitis on right. 4. Arthritis of right acromioclavicular joint.  Plan:  1. Treatment options were discussed today with the patient. 2. The patient is still experiencing ongoing right shoulder pain. Recent CT scan did demonstrate evidence of a small full-thickness rotator cuff tear in addition to underlying biceps tendinopathy a SLAP tear in addition to [facility] joint osteoarthritic changes. 3. After a discussion of the risk and benefits of both surgical and nonsurgical intervention the patient would like to proceed with surgery for the right shoulder at this time. 4. The patient will be scheduled for a right shoulder arthroscopy with debridement decompression, distal clavicle excision, biceps tenodesis and rotator cuff repair. Surgery will be performed by Dr. Joice Lofts in the future. 5. This document will serve as a surgical history and physical for the patient. 6. The patient will follow-up per standard postop protocol. 7. The patient can contact the clinic if she has any questions, new symptoms develop or symptoms worsen.  The procedure was discussed with the patient, as were the potential risks (including bleeding, infection, nerve and/or blood vessel injury, persistent or recurrent pain, failure of the repair, progression of arthritis, need for further surgery, blood clots, strokes, heart attacks and/or arhythmias, pneumonia, etc.) and benefits. The patient states her understanding and wishes to proceed.    H&P reviewed and patient re-examined. No changes.

## 2023-03-08 NOTE — Op Note (Signed)
03/08/2023  3:47 PM  Patient:   Rachael Barnes  Pre-Op Diagnosis:   Impingement/tendinopathy with rotator cuff tear and degenerative joint disease of AC joint, right shoulder.  Post-Op Diagnosis:   Adhesive capsulitis, impingement/tendinopathy with rotator cuff tear, SLAP tear, degenerative joint disease of AC joint, and biceps tendinopathy, right shoulder.  Procedure:   Manipulation under anesthesia, extensive arthroscopic debridement, arthroscopic subacromial decompression, arthroscopic distal clavicle excision, mini-open rotator cuff repair, and biceps tenolysis, right shoulder.  Anesthesia:   General endotracheal with interscalene block using Exparel placed preoperatively by the anesthesiologist.  Surgeon:   Maryagnes Amos, MD  Assistant:   Griffin Basil, RNFA  Findings:   As above. There was a full-thickness tear involving the mid and posterior insertional fibers of the supraspinatus tendon. The remainder of the rotator cuff was in satisfactory condition. There was a SLAP tear extended from the 11 to 1 o'clock position. The biceps tendon demonstrated moderate "lip sticking" without any partial-thickness tearing. The articular surfaces of the glenoid and humerus both were in satisfactory condition.  Complications:   None  Fluids:   600 cc  Estimated blood loss:   10 cc  Tourniquet time:   None  Drains:   None  Closure:   Staples      Brief clinical note:   The patient is a 72 year old female with a history of progressively worsening right shoulder pain. The patient's symptoms have progressed despite medications, activity modification, etc. The patient's history and examination are consistent with impingement/tendinopathy with a rotator cuff tear. These findings were confirmed by a preoperative arthro-CT scan. The patient presents at this time for definitive management of these shoulder symptoms.  Procedure:   The patient underwent placement of an interscalene block by the  anesthesiologist in the preoperative holding area before being brought into the operating room and lain in the supine position. The patient then underwent general endotracheal intubation and anesthesia before being repositioned in the beach chair position using the beach chair positioner. The right shoulder and upper extremity were prepped with ChloraPrep solution before being draped sterilely. Preoperative antibiotics were administered. A timeout was performed to confirm the proper surgical site before the expected portal sites and incision site were injected with 0.5% Sensorcaine with epinephrine.    Preliminary assessment of shoulder range of motion demonstrated the patient to have some limitation with abduction and external rotation, consistent with adhesive capsulitis. The right shoulder was gently manipulated in both abduction and external rotation, as well as adduction and internal rotation. Several palpable and audible pops were heard as the scar tissue released, permitting full range of motion of the shoulder.  A posterior portal was created and the glenohumeral joint thoroughly inspected with the findings as described above. An anterior portal was created using an outside-in technique. The labrum and rotator cuff were further probed, again confirming the above-noted findings. The areas of synovitis were debrided back to stable margins using the full-radius resector, as was the torn portion of the rotator cuff. The areas of labral fraying also were debrided back to stable margins using the full-radius dissector. The ArthroCare wand was inserted and used to release the biceps tendon from its labral anchor.  It also was used to debride the rotator interval, as well as to "anneal" the labrum superiorly and anteriorly. The instruments were removed from the joint after suctioning the excess fluid.  The camera was repositioned through the posterior portal into the subacromial space. A separate lateral  portal was created using an  outside-in technique. The 3.5 mm full-radius resector was introduced and used to perform a subtotal bursectomy. The ArthroCare wand was then inserted and used to remove the periosteal tissue off the undersurface of the anterior third of the acromion as well as to recess the coracoacromial ligament from its attachment along the anterior and lateral margins of the acromion. The 4.0 mm acromionizing bur was introduced and used to complete the decompression by removing the undersurface of the anterior third of the acromion.   The ArthroCare wand was reintroduced and used to debride the capsular tissues from the undersurface of the [facility] joint and distal clavicle. The camera was then placed in the lateral portal and instrumentation performed through the anterior portal. The 4 mm acromionizing bur was reintroduced and used to remove the distal 8 to 10 mm of the distal clavicle. The adequacy of distal clavicle excision was verified both by assessing the distal clavicle from the lateral portal as well as from the anterior portal. The full radius resector was reintroduced to remove any residual bony debris before the ArthroCare wand was reintroduced to obtain hemostasis. The instruments were then removed from the subacromial space after suctioning the excess fluid.  An approximately 4-5 cm incision was made over the anterolateral aspect of the shoulder beginning at the anterolateral corner of the acromion and extending distally in line with the bicipital groove. This incision was carried down through the subcutaneous tissues to expose the deltoid fascia. The raphae between the anterior and middle thirds was identified and this plane developed to provide access into the subacromial space. Additional bursal tissues were debrided sharply using Metzenbaum scissors. The rotator cuff tear was readily identified. The margins were debrided sharply with a #15 blade and the exposed greater tuberosity  roughened with a rongeur. The tear was repaired using two [employer] 2.9 mm Q-Fix anchors. These sutures were then brought back laterally and secured using two [employer] Nephew Healicoil knotless RegeneSorb anchors to create a two-layer closure. An apparent watertight closure was obtained.  The wound was copiously irrigated with sterile saline solution before the deltoid raphae was reapproximated using 2-0 Vicryl interrupted sutures. The subcutaneous tissues were closed in two layers using 2-0 Vicryl interrupted sutures before the skin was closed using staples. The portal sites also were closed using staples. A sterile bulky dressing was applied to the shoulder before the arm was placed into a shoulder immobilizer. The patient was then awakened, extubated, and returned to the recovery room in satisfactory condition after tolerating the procedure well.

## 2023-03-09 ENCOUNTER — Encounter: Payer: Self-pay | Admitting: Surgery
